# Patient Record
Sex: Female | Born: 1970 | Race: White | Hispanic: No | Marital: Single | Smoking: Former smoker
Health system: Southern US, Community
[De-identification: ages and names within clinical notes are randomized; demographics above are authoritative.]

## PROBLEM LIST (undated history)

## (undated) DIAGNOSIS — J45909 Unspecified asthma, uncomplicated: Secondary | ICD-10-CM

## (undated) DIAGNOSIS — I10 Essential (primary) hypertension: Secondary | ICD-10-CM

## (undated) DIAGNOSIS — D649 Anemia, unspecified: Secondary | ICD-10-CM

## (undated) DIAGNOSIS — R87810 Cervical high risk human papillomavirus (HPV) DNA test positive: Secondary | ICD-10-CM

## (undated) DIAGNOSIS — F1910 Other psychoactive substance abuse, uncomplicated: Secondary | ICD-10-CM

## (undated) DIAGNOSIS — R87629 Unspecified abnormal cytological findings in specimens from vagina: Secondary | ICD-10-CM

## (undated) HISTORY — DX: Unspecified abnormal cytological findings in specimens from vagina: R87.629

## (undated) HISTORY — DX: Unspecified asthma, uncomplicated: J45.909

## (undated) HISTORY — PX: CHOLECYSTECTOMY: SHX55

## (undated) HISTORY — DX: Other psychoactive substance abuse, uncomplicated: F19.10

## (undated) HISTORY — DX: Cervical high risk human papillomavirus (HPV) DNA test positive: R87.810

## (undated) HISTORY — DX: Essential (primary) hypertension: I10

---

## 1997-11-19 ENCOUNTER — Emergency Department (HOSPITAL_COMMUNITY): Admission: EM | Admit: 1997-11-19 | Discharge: 1997-11-19 | Payer: Self-pay | Admitting: Emergency Medicine

## 2001-07-13 ENCOUNTER — Other Ambulatory Visit: Admission: RE | Admit: 2001-07-13 | Discharge: 2001-07-13 | Payer: Self-pay | Admitting: Family Medicine

## 2004-04-30 ENCOUNTER — Emergency Department (HOSPITAL_COMMUNITY): Admission: EM | Admit: 2004-04-30 | Discharge: 2004-05-01 | Payer: Self-pay | Admitting: Emergency Medicine

## 2004-07-12 ENCOUNTER — Emergency Department (HOSPITAL_COMMUNITY): Admission: EM | Admit: 2004-07-12 | Discharge: 2004-07-12 | Payer: Self-pay | Admitting: Emergency Medicine

## 2004-07-19 ENCOUNTER — Ambulatory Visit (HOSPITAL_COMMUNITY): Admission: RE | Admit: 2004-07-19 | Discharge: 2004-07-19 | Payer: Self-pay | Admitting: Emergency Medicine

## 2012-05-14 ENCOUNTER — Encounter: Payer: Self-pay | Admitting: *Deleted

## 2012-05-14 DIAGNOSIS — I10 Essential (primary) hypertension: Secondary | ICD-10-CM | POA: Insufficient documentation

## 2012-05-14 DIAGNOSIS — J45909 Unspecified asthma, uncomplicated: Secondary | ICD-10-CM | POA: Insufficient documentation

## 2012-05-17 ENCOUNTER — Encounter: Payer: Self-pay | Admitting: Obstetrics & Gynecology

## 2012-05-17 ENCOUNTER — Ambulatory Visit (INDEPENDENT_AMBULATORY_CARE_PROVIDER_SITE_OTHER): Payer: Medicaid Other | Admitting: Obstetrics & Gynecology

## 2012-05-17 VITALS — BP 122/90 | Ht 64.0 in | Wt 149.5 lb

## 2012-05-17 DIAGNOSIS — Z3049 Encounter for surveillance of other contraceptives: Secondary | ICD-10-CM

## 2012-05-17 DIAGNOSIS — Z3202 Encounter for pregnancy test, result negative: Secondary | ICD-10-CM

## 2012-05-17 DIAGNOSIS — IMO0001 Reserved for inherently not codable concepts without codable children: Secondary | ICD-10-CM

## 2012-05-17 MED ORDER — MEDROXYPROGESTERONE ACETATE 150 MG/ML IM SUSP
150.0000 mg | Freq: Once | INTRAMUSCULAR | Status: AC
  Administered 2012-05-17: 150 mg via INTRAMUSCULAR

## 2012-08-09 ENCOUNTER — Encounter: Payer: Self-pay | Admitting: *Deleted

## 2012-08-09 ENCOUNTER — Ambulatory Visit: Payer: Medicaid Other

## 2012-08-09 ENCOUNTER — Other Ambulatory Visit: Payer: Medicaid Other | Admitting: Obstetrics & Gynecology

## 2012-08-12 ENCOUNTER — Other Ambulatory Visit: Payer: Medicaid Other | Admitting: Obstetrics & Gynecology

## 2012-08-16 ENCOUNTER — Encounter: Payer: Self-pay | Admitting: *Deleted

## 2012-08-16 ENCOUNTER — Other Ambulatory Visit: Payer: Medicaid Other | Admitting: Obstetrics & Gynecology

## 2012-08-17 ENCOUNTER — Encounter: Payer: Self-pay | Admitting: Adult Health

## 2012-08-17 ENCOUNTER — Ambulatory Visit (INDEPENDENT_AMBULATORY_CARE_PROVIDER_SITE_OTHER): Payer: Medicaid Other | Admitting: Adult Health

## 2012-08-17 VITALS — Ht 61.0 in | Wt 148.0 lb

## 2012-08-17 DIAGNOSIS — Z3202 Encounter for pregnancy test, result negative: Secondary | ICD-10-CM

## 2012-08-17 DIAGNOSIS — Z3049 Encounter for surveillance of other contraceptives: Secondary | ICD-10-CM

## 2012-08-17 MED ORDER — MEDROXYPROGESTERONE ACETATE 150 MG/ML IM SUSP
150.0000 mg | Freq: Once | INTRAMUSCULAR | Status: AC
  Administered 2012-08-17: 150 mg via INTRAMUSCULAR

## 2012-08-17 NOTE — Progress Notes (Signed)
Pt here for Depo shot. Not having any problems or concerns. Will come back in 12 weeks for next injection.

## 2012-09-01 ENCOUNTER — Other Ambulatory Visit: Payer: Medicaid Other | Admitting: Obstetrics & Gynecology

## 2012-09-21 ENCOUNTER — Other Ambulatory Visit: Payer: Medicaid Other | Admitting: Obstetrics & Gynecology

## 2012-11-08 ENCOUNTER — Other Ambulatory Visit: Payer: Self-pay | Admitting: Obstetrics and Gynecology

## 2012-11-09 ENCOUNTER — Encounter: Payer: Self-pay | Admitting: Adult Health

## 2012-11-09 ENCOUNTER — Ambulatory Visit (INDEPENDENT_AMBULATORY_CARE_PROVIDER_SITE_OTHER): Payer: Medicaid Other | Admitting: Adult Health

## 2012-11-09 VITALS — BP 144/100 | Ht 61.0 in | Wt 151.0 lb

## 2012-11-09 DIAGNOSIS — Z3049 Encounter for surveillance of other contraceptives: Secondary | ICD-10-CM

## 2012-11-09 DIAGNOSIS — Z3202 Encounter for pregnancy test, result negative: Secondary | ICD-10-CM

## 2012-11-09 DIAGNOSIS — Z309 Encounter for contraceptive management, unspecified: Secondary | ICD-10-CM

## 2012-11-09 MED ORDER — MEDROXYPROGESTERONE ACETATE 150 MG/ML IM SUSP
150.0000 mg | Freq: Once | INTRAMUSCULAR | Status: AC
  Administered 2012-11-09: 150 mg via INTRAMUSCULAR

## 2012-12-23 ENCOUNTER — Other Ambulatory Visit: Payer: Medicaid Other | Admitting: Obstetrics & Gynecology

## 2013-01-17 ENCOUNTER — Other Ambulatory Visit (HOSPITAL_COMMUNITY)
Admission: RE | Admit: 2013-01-17 | Discharge: 2013-01-17 | Disposition: A | Discharge status: Home / Self Care | Payer: Medicaid Other | Source: Ambulatory Visit | Attending: Obstetrics & Gynecology | Admitting: Obstetrics & Gynecology

## 2013-01-17 ENCOUNTER — Ambulatory Visit (INDEPENDENT_AMBULATORY_CARE_PROVIDER_SITE_OTHER): Payer: Medicaid Other | Admitting: Obstetrics & Gynecology

## 2013-01-17 ENCOUNTER — Encounter: Payer: Self-pay | Admitting: Obstetrics & Gynecology

## 2013-01-17 VITALS — BP 140/90 | Ht 59.0 in | Wt 151.0 lb

## 2013-01-17 DIAGNOSIS — Z01419 Encounter for gynecological examination (general) (routine) without abnormal findings: Secondary | ICD-10-CM | POA: Insufficient documentation

## 2013-01-17 DIAGNOSIS — Z0000 Encounter for general adult medical examination without abnormal findings: Secondary | ICD-10-CM

## 2013-01-17 MED ORDER — MEDROXYPROGESTERONE ACETATE 150 MG/ML IM SUSP: 150.0000 mg | INTRAMUSCULAR | Status: DC

## 2013-01-17 NOTE — Progress Notes (Signed)
Patient ID: [name], female   DOB: Oct 16, 1970, 42 y.o.   MRN: [id number] Subjective:     [name] is a 42 y.o. female here for a routine exam.  No LMP recorded. Patient has had an injection. G3P3 Birth Control Method:  depo Menstrual Calendar(currently): amenorrheic  Current complaints: none.   Current acute medical issues:  none   Recent Gynecologic History No LMP recorded. Patient has had an injection. Last Pap: 2013,  normal Last mammogram: na,  na  Past Medical History  Diagnosis Date  . Hypertension   . Asthma   . Drug abuse     Past Surgical History  Procedure Laterality Date  . Cholecystectomy      OB History   Grav Para Term Preterm Abortions TAB SAB Ect Mult Living   3 3        3       History   Social History  . Marital Status: Married    Spouse Name: N/A    Number of Children: N/A  . Years of Education: N/A   Social History Main Topics  . Smoking status: Current Every Day Smoker -- 1.00 packs/day for 17 years    Types: Cigarettes  . Smokeless tobacco: Never Used  . Alcohol Use: Yes     Comment: occ.  . Drug Use: No  . Sexual Activity: Yes    Birth Control/ Protection: Injection   Other Topics Concern  . None   Social History Narrative  . None    Family History  Problem Relation Age of Onset  . Hypertension Father      Review of Systems  Review of Systems  Constitutional: Negative for fever, chills, weight loss, malaise/fatigue and diaphoresis.  HENT: Negative for hearing loss, ear pain, nosebleeds, congestion, sore throat, neck pain, tinnitus and ear discharge.   Eyes: Negative for blurred vision, double vision, photophobia, pain, discharge and redness.  Respiratory: Negative for cough, hemoptysis, sputum production, shortness of breath, wheezing and stridor.   Cardiovascular: Negative for chest pain, palpitations, orthopnea, claudication, leg swelling and PND.  Gastrointestinal: negative for abdominal pain. Negative for  heartburn, nausea, vomiting, diarrhea, constipation, blood in stool and melena.  Genitourinary: Negative for dysuria, urgency, frequency, hematuria and flank pain.  Musculoskeletal: Negative for myalgias, back pain, joint pain and falls.  Skin: Negative for itching and rash.  Neurological: Negative for dizziness, tingling, tremors, sensory change, speech change, focal weakness, seizures, loss of consciousness, weakness and headaches.  Endo/Heme/Allergies: Negative for environmental allergies and polydipsia. Does not bruise/bleed easily.  Psychiatric/Behavioral: Negative for depression, suicidal ideas, hallucinations, memory loss and substance abuse. The patient is not nervous/anxious and does not have insomnia.        Objective:    Physical Exam  Vitals reviewed. Constitutional: She is oriented to person, place, and time. She appears well-developed and well-nourished.  HENT:  Head: Normocephalic and atraumatic.        Right Ear: External ear normal.  Left Ear: External ear normal.  Nose: Nose normal.  Mouth/Throat: Oropharynx is clear and moist.  Eyes: Conjunctivae and EOM are normal. Pupils are equal, round, and reactive to light. Right eye exhibits no discharge. Left eye exhibits no discharge. No scleral icterus.  Neck: Normal range of motion. Neck supple. No tracheal deviation present. No thyromegaly present.  Cardiovascular: Normal rate, regular rhythm, normal heart sounds and intact distal pulses.  Exam reveals no gallop and no friction rub.   No murmur heard. Respiratory: Effort normal  and breath sounds normal. No respiratory distress. She has no wheezes. She has no rales. She exhibits no tenderness.  GI: Soft. Bowel sounds are normal. She exhibits no distension and no mass. There is no tenderness. There is no rebound and no guarding.  Genitourinary:  Breasts no masses skin changes or nipple changes bilaterally      Vulva is normal without lesions Vagina is pink moist without  discharge Cervix normal in appearance and pap is done Uterus is normal size shape and contour Adnexa is negative with normal sized ovaries   Musculoskeletal: Normal range of motion. She exhibits no edema and no tenderness.  Neurological: She is alert and oriented to person, place, and time. She has normal reflexes. She displays normal reflexes. No cranial nerve deficit. She exhibits normal muscle tone. Coordination normal.  Skin: Skin is warm and dry. No rash noted. No erythema. No pallor.  Psychiatric: She has a normal mood and affect. Her behavior is normal. Judgment and thought content normal.       Assessment:    Healthy female exam.    Plan:    Contraception: Depo-Provera injections. Mammogram ordered. Follow up in: 1 year.

## 2013-01-17 NOTE — Addendum Note (Signed)
Addended by: Richardson Chiquito on: 01/17/2013 02:56 PM   Modules accepted: Orders

## 2013-02-01 ENCOUNTER — Ambulatory Visit: Payer: Medicaid Other

## 2013-02-08 ENCOUNTER — Ambulatory Visit (INDEPENDENT_AMBULATORY_CARE_PROVIDER_SITE_OTHER): Payer: Medicaid Other | Admitting: Adult Health

## 2013-02-08 ENCOUNTER — Encounter: Payer: Self-pay | Admitting: Adult Health

## 2013-02-08 VITALS — BP 110/72 | Ht 61.0 in | Wt 152.0 lb

## 2013-02-08 DIAGNOSIS — Z3200 Encounter for pregnancy test, result unknown: Secondary | ICD-10-CM

## 2013-02-08 DIAGNOSIS — Z3202 Encounter for pregnancy test, result negative: Secondary | ICD-10-CM

## 2013-02-08 DIAGNOSIS — Z309 Encounter for contraceptive management, unspecified: Secondary | ICD-10-CM

## 2013-02-08 DIAGNOSIS — Z3049 Encounter for surveillance of other contraceptives: Secondary | ICD-10-CM

## 2013-02-08 LAB — POCT URINE PREGNANCY: Preg Test, Ur: NEGATIVE

## 2013-02-08 MED ORDER — MEDROXYPROGESTERONE ACETATE 150 MG/ML IM SUSP
150.0000 mg | Freq: Once | INTRAMUSCULAR | Status: AC
  Administered 2013-02-08: 150 mg via INTRAMUSCULAR

## 2013-05-03 ENCOUNTER — Ambulatory Visit: Payer: Medicaid Other

## 2013-05-06 ENCOUNTER — Encounter: Payer: Self-pay | Admitting: Obstetrics & Gynecology

## 2013-05-06 ENCOUNTER — Ambulatory Visit (INDEPENDENT_AMBULATORY_CARE_PROVIDER_SITE_OTHER): Payer: Medicaid Other | Admitting: Obstetrics & Gynecology

## 2013-05-06 ENCOUNTER — Encounter (INDEPENDENT_AMBULATORY_CARE_PROVIDER_SITE_OTHER): Payer: Self-pay

## 2013-05-06 VITALS — BP 144/90 | Ht 61.0 in | Wt 153.5 lb

## 2013-05-06 DIAGNOSIS — Z3049 Encounter for surveillance of other contraceptives: Secondary | ICD-10-CM

## 2013-05-06 DIAGNOSIS — Z3202 Encounter for pregnancy test, result negative: Secondary | ICD-10-CM

## 2013-05-06 DIAGNOSIS — Z309 Encounter for contraceptive management, unspecified: Secondary | ICD-10-CM

## 2013-05-06 LAB — POCT URINE PREGNANCY: Preg Test, Ur: NEGATIVE

## 2013-05-06 MED ORDER — MEDROXYPROGESTERONE ACETATE 150 MG/ML IM SUSP
150.0000 mg | Freq: Once | INTRAMUSCULAR | Status: AC
  Administered 2013-05-06: 150 mg via INTRAMUSCULAR

## 2013-05-06 NOTE — Progress Notes (Signed)
Pt here for Depo. No complaints at this time. To return in 12 weeks for next shot. JSY 

## 2013-07-29 ENCOUNTER — Ambulatory Visit (INDEPENDENT_AMBULATORY_CARE_PROVIDER_SITE_OTHER): Payer: Medicaid Other | Admitting: Adult Health

## 2013-07-29 DIAGNOSIS — Z3202 Encounter for pregnancy test, result negative: Secondary | ICD-10-CM

## 2013-07-29 DIAGNOSIS — Z308 Encounter for other contraceptive management: Secondary | ICD-10-CM

## 2013-07-29 DIAGNOSIS — Z3049 Encounter for surveillance of other contraceptives: Secondary | ICD-10-CM

## 2013-07-29 LAB — POCT URINE PREGNANCY: PREG TEST UR: NEGATIVE

## 2013-07-29 MED ORDER — MEDROXYPROGESTERONE ACETATE 150 MG/ML IM SUSP
150.0000 mg | Freq: Once | INTRAMUSCULAR | Status: AC
  Administered 2013-07-29: 150 mg via INTRAMUSCULAR

## 2013-09-18 ENCOUNTER — Observation Stay (HOSPITAL_COMMUNITY)
Admission: EM | Admit: 2013-09-18 | Discharge: 2013-09-20 | Disposition: A | Discharge status: Home / Self Care | Payer: Medicaid Other | Attending: Orthopedic Surgery | Admitting: Orthopedic Surgery

## 2013-09-18 ENCOUNTER — Emergency Department (HOSPITAL_COMMUNITY): Payer: Medicaid Other

## 2013-09-18 ENCOUNTER — Encounter (HOSPITAL_COMMUNITY): Payer: Self-pay | Admitting: Emergency Medicine

## 2013-09-18 DIAGNOSIS — S99919A Unspecified injury of unspecified ankle, initial encounter: Secondary | ICD-10-CM

## 2013-09-18 DIAGNOSIS — F17200 Nicotine dependence, unspecified, uncomplicated: Secondary | ICD-10-CM | POA: Insufficient documentation

## 2013-09-18 DIAGNOSIS — S8990XA Unspecified injury of unspecified lower leg, initial encounter: Secondary | ICD-10-CM | POA: Insufficient documentation

## 2013-09-18 DIAGNOSIS — J45909 Unspecified asthma, uncomplicated: Secondary | ICD-10-CM | POA: Insufficient documentation

## 2013-09-18 DIAGNOSIS — Z79899 Other long term (current) drug therapy: Secondary | ICD-10-CM | POA: Diagnosis not present

## 2013-09-18 DIAGNOSIS — S3210XA Unspecified fracture of sacrum, initial encounter for closed fracture: Secondary | ICD-10-CM

## 2013-09-18 DIAGNOSIS — R109 Unspecified abdominal pain: Secondary | ICD-10-CM | POA: Insufficient documentation

## 2013-09-18 DIAGNOSIS — Z3202 Encounter for pregnancy test, result negative: Secondary | ICD-10-CM | POA: Diagnosis not present

## 2013-09-18 DIAGNOSIS — S99929A Unspecified injury of unspecified foot, initial encounter: Secondary | ICD-10-CM

## 2013-09-18 DIAGNOSIS — S82853A Displaced trimalleolar fracture of unspecified lower leg, initial encounter for closed fracture: Principal | ICD-10-CM | POA: Insufficient documentation

## 2013-09-18 DIAGNOSIS — S1093XA Contusion of unspecified part of neck, initial encounter: Secondary | ICD-10-CM | POA: Diagnosis not present

## 2013-09-18 DIAGNOSIS — Y92410 Unspecified street and highway as the place of occurrence of the external cause: Secondary | ICD-10-CM | POA: Diagnosis not present

## 2013-09-18 DIAGNOSIS — S82852A Displaced trimalleolar fracture of left lower leg, initial encounter for closed fracture: Secondary | ICD-10-CM

## 2013-09-18 DIAGNOSIS — S322XXA Fracture of coccyx, initial encounter for closed fracture: Secondary | ICD-10-CM

## 2013-09-18 DIAGNOSIS — S0083XA Contusion of other part of head, initial encounter: Secondary | ICD-10-CM | POA: Insufficient documentation

## 2013-09-18 DIAGNOSIS — S0003XA Contusion of scalp, initial encounter: Secondary | ICD-10-CM | POA: Insufficient documentation

## 2013-09-18 DIAGNOSIS — Y9389 Activity, other specified: Secondary | ICD-10-CM | POA: Insufficient documentation

## 2013-09-18 DIAGNOSIS — I10 Essential (primary) hypertension: Secondary | ICD-10-CM | POA: Insufficient documentation

## 2013-09-18 LAB — COMPREHENSIVE METABOLIC PANEL
ALK PHOS: 103 U/L (ref 39–117)
ALT: 14 U/L (ref 0–35)
ANION GAP: 16 — AB (ref 5–15)
AST: 22 U/L (ref 0–37)
Albumin: 4.1 g/dL (ref 3.5–5.2)
BUN: 10 mg/dL (ref 6–23)
CO2: 20 mEq/L (ref 19–32)
Calcium: 8.4 mg/dL (ref 8.4–10.5)
Chloride: 109 mEq/L (ref 96–112)
Creatinine, Ser: 0.69 mg/dL (ref 0.50–1.10)
GFR calc Af Amer: >90 mL/min (ref >90)
GFR calc non Af Amer: >90 mL/min (ref >90)
Glucose, Bld: 98 mg/dL (ref 70–99)
Potassium: 3.7 mEq/L (ref 3.7–5.3)
Sodium: 145 mEq/L (ref 137–147)
TOTAL PROTEIN: 6.9 g/dL (ref 6.0–8.3)
Total Bilirubin: 0.8 mg/dL (ref 0.3–1.2)

## 2013-09-18 LAB — URINALYSIS, ROUTINE W REFLEX MICROSCOPIC
Bilirubin Urine: NEGATIVE
GLUCOSE, UA: NEGATIVE mg/dL
Leukocytes, UA: NEGATIVE
Nitrite: NEGATIVE
PH: 5.5 (ref 5.0–8.0)
Protein, ur: 30 mg/dL — AB
SPECIFIC GRAVITY, URINE: 1.025 (ref 1.005–1.030)
UROBILINOGEN UA: 0.2 mg/dL (ref 0.0–1.0)

## 2013-09-18 LAB — URINE MICROSCOPIC-ADD ON

## 2013-09-18 LAB — CBC WITH DIFFERENTIAL/PLATELET
BASOS ABS: 0 10*3/uL (ref 0.0–0.1)
Basophils Relative: 0 % (ref 0–1)
EOS PCT: 0 % (ref 0–5)
Eosinophils Absolute: 0 10*3/uL (ref 0.0–0.7)
HCT: 42.6 % (ref 36.0–46.0)
HEMOGLOBIN: 14.7 g/dL (ref 12.0–15.0)
LYMPHS ABS: 1.9 10*3/uL (ref 0.7–4.0)
Lymphocytes Relative: 9 % — ABNORMAL LOW (ref 12–46)
MCH: 31.8 pg (ref 26.0–34.0)
MCHC: 34.5 g/dL (ref 30.0–36.0)
MCV: 92.2 fL (ref 78.0–100.0)
MONO ABS: 2 10*3/uL — AB (ref 0.1–1.0)
MONOS PCT: 9 % (ref 3–12)
NEUTROS ABS: 17.2 10*3/uL — AB (ref 1.7–7.7)
NEUTROS PCT: 82 % — AB (ref 43–77)
PLATELETS: 383 10*3/uL (ref 150–400)
RBC: 4.62 MIL/uL (ref 3.87–5.11)
RDW: 13 % (ref 11.5–15.5)
WBC: 21.2 10*3/uL — AB (ref 4.0–10.5)

## 2013-09-18 LAB — POC URINE PREG, ED: PREG TEST UR: NEGATIVE

## 2013-09-18 MED ORDER — SODIUM CHLORIDE 0.9 % IV SOLN: INTRAVENOUS | Status: DC

## 2013-09-18 MED ORDER — MORPHINE SULFATE 4 MG/ML IJ SOLN
INTRAMUSCULAR | Status: AC
  Administered 2013-09-18: 4 mg via INTRAVENOUS
  Filled 2013-09-18: qty 1

## 2013-09-18 MED ORDER — MORPHINE SULFATE 4 MG/ML IJ SOLN
4.0000 mg | Freq: Once | INTRAMUSCULAR | Status: AC
  Administered 2013-09-18: 4 mg via INTRAVENOUS

## 2013-09-18 MED ORDER — HYDROMORPHONE HCL PF 1 MG/ML IJ SOLN
1.0000 mg | INTRAMUSCULAR | Status: AC | PRN
  Administered 2013-09-18: 1 mg via INTRAVENOUS
  Filled 2013-09-18 (×4): qty 1

## 2013-09-18 MED ORDER — HYDROCODONE-ACETAMINOPHEN 5-325 MG PO TABS
1.0000 | ORAL_TABLET | ORAL | Status: DC | PRN
  Administered 2013-09-18 – 2013-09-19 (×2): 1 via ORAL
  Filled 2013-09-18 (×2): qty 1

## 2013-09-18 MED ORDER — IOHEXOL 300 MG/ML  SOLN
100.0000 mL | Freq: Once | INTRAMUSCULAR | Status: AC | PRN
  Administered 2013-09-18: 100 mL via INTRAVENOUS

## 2013-09-18 MED ORDER — MORPHINE SULFATE 4 MG/ML IJ SOLN
4.0000 mg | Freq: Once | INTRAMUSCULAR | Status: AC
  Administered 2013-09-18: 4 mg via INTRAVENOUS
  Filled 2013-09-18: qty 1

## 2013-09-18 MED ORDER — MIDAZOLAM HCL 2 MG/2ML IJ SOLN
6.0000 mg | Freq: Once | INTRAMUSCULAR | Status: DC
  Filled 2013-09-18: qty 6

## 2013-09-18 MED ORDER — MIDAZOLAM HCL 2 MG/2ML IJ SOLN
INTRAMUSCULAR | Status: AC | PRN
  Administered 2013-09-18: 3 mg via INTRAVENOUS

## 2013-09-18 MED ORDER — ONDANSETRON HCL 4 MG/2ML IJ SOLN: 4.0000 mg | Freq: Three times a day (TID) | INTRAMUSCULAR | Status: DC | PRN

## 2013-09-18 MED ORDER — HYDROMORPHONE HCL PF 1 MG/ML IJ SOLN
0.5000 mg | INTRAMUSCULAR | Status: DC | PRN
  Administered 2013-09-18 – 2013-09-19 (×3): 1 mg via INTRAVENOUS
  Filled 2013-09-18: qty 1

## 2013-09-18 MED ORDER — ONDANSETRON HCL 4 MG/2ML IJ SOLN
4.0000 mg | Freq: Four times a day (QID) | INTRAMUSCULAR | Status: DC | PRN
Start: 2013-09-18 — End: 2013-09-20

## 2013-09-18 MED ORDER — MEDROXYPROGESTERONE ACETATE 150 MG/ML IM SUSP
150.0000 mg | INTRAMUSCULAR | Status: DC
Start: 2013-09-18 — End: 2013-09-18

## 2013-09-18 MED ORDER — HYDROMORPHONE HCL PF 1 MG/ML IJ SOLN
INTRAMUSCULAR | Status: AC
  Filled 2013-09-18: qty 1

## 2013-09-18 MED ORDER — SODIUM CHLORIDE 0.9 % IV SOLN
INTRAVENOUS | Status: AC
  Administered 2013-09-18: 21:00:00 via INTRAVENOUS
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
## 2013-09-19 MED ORDER — KETOROLAC TROMETHAMINE 30 MG/ML IJ SOLN
30.0000 mg | Freq: Four times a day (QID) | INTRAMUSCULAR | Status: DC
  Administered 2013-09-19: 30 mg via INTRAVENOUS
  Filled 2013-09-19: qty 1

## 2013-09-19 MED ORDER — OXYCODONE-ACETAMINOPHEN 5-325 MG PO TABS: 1.0000 | ORAL_TABLET | ORAL | Status: DC | PRN

## 2013-09-19 MED ORDER — OXYCODONE-ACETAMINOPHEN 5-325 MG PO TABS
1.0000 | ORAL_TABLET | ORAL | Status: DC | PRN
  Administered 2013-09-19 – 2013-09-20 (×6): 1 via ORAL
  Filled 2013-09-19 (×7): qty 1

## 2013-09-19 MED ORDER — HYDROMORPHONE HCL PF 1 MG/ML IJ SOLN: 1.0000 mg | INTRAMUSCULAR | Status: DC | PRN

## 2013-09-19 MED ORDER — HYDROMORPHONE HCL PF 1 MG/ML IJ SOLN
2.0000 mg | INTRAMUSCULAR | Status: DC | PRN
  Administered 2013-09-19 (×3): 2 mg via INTRAVENOUS
  Filled 2013-09-19 (×2): qty 2
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
## 2013-09-19 NOTE — Progress Notes (Signed)
UR Completed.  [name] [phone] 09/19/2013  

## 2013-09-19 NOTE — Care Management Note (Signed)
    Page 1 of 1   09/20/2013     11:41:39 AM CARE MANAGEMENT NOTE 09/20/2013  Patient:  [name],[name]   Account Number:  [id number]  Date Initiated:  09/19/2013  Documentation initiated by:  Anibal HendersonBOLDEN,Tejuan Gholson  Subjective/Objective Assessment:   Admitted with Fx ankle. had closed reduction, and will return for surgery when swelling goes down. Pt was to be D/C home today but chg to po meds and pt in severe pain, so is to spend another night here for IV pain control     Action/Plan:   Will need rolling walker for home use. PT recommended HH PT, but pt will be on bed rest with BRP with leg elevated. lives with spouse and 42 yr old son.   Anticipated DC Date:  09/20/2013   Anticipated DC Plan:  HOME/SELF CARE      DC Planning Services  CM consult      PAC Choice  DURABLE MEDICAL EQUIPMENT   Choice offered to / List presented to:     DME arranged  [facility]      DME agency  [facility]        Status of service:  Completed, signed off Medicare Important Message given?   (If response is "NO", the following Medicare IM given date fields will be blank) Date Medicare IM given:   Medicare IM given by:   Date Additional Medicare IM given:   Additional Medicare IM given by:    Discharge Disposition:  HOME/SELF CARE  Per UR Regulation:  Reviewed for med. necessity/level of care/duration of stay  If discussed at Long Length of Stay Meetings, dates discussed:    Comments:  09/20/13 1130 Anibal HendersonGeneva Krystn Dermody RN/CM Pt being D/C home today 09/19/13 1600 Anibal HendersonGeneva Eward Rutigliano RN/CM

## 2013-09-20 MED ORDER — KETOROLAC TROMETHAMINE 10 MG PO TABS
10.0000 mg | ORAL_TABLET | Freq: Four times a day (QID) | ORAL | Status: DC
  Administered 2013-09-20 (×4): 10 mg via ORAL
  Filled 2013-09-20 (×4): qty 1

## 2013-09-20 MED ORDER — IBUPROFEN 800 MG PO TABS: 800.0000 mg | ORAL_TABLET | Freq: Three times a day (TID) | ORAL | Status: DC | PRN

## 2013-09-20 MED ORDER — OXYCODONE-ACETAMINOPHEN 5-325 MG PO TABS: 1.0000 | ORAL_TABLET | ORAL | Status: DC | PRN
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
## 2013-09-20 NOTE — Progress Notes (Signed)
3:05pm D/C instructions & paperwork/hard Rxs given to pt. Pt confirmed that she understands d/c instructions to not bear weight on LEFT ankle & elevate when sitting. Pt aware to be at pre-op apt with Dr.Harrison on 09/27/13 @ 10:15AM. Hard Rxs given to pt to be filled. Pt currently waiting on a ride from boyfriend when he gets off work, expected time between 8pm-9pm. IV removed last night on night shift, shift covered, clean, dry with no bleeding noted at this time. Pt reminded to follow d/c instructions to optimize best outcome including no driving at this time, she confirmed that she understands. Will assist pt with change of clothes this shift after dinner as requested by pt. Walker noted in pt's room brought by [facility], pt aware to take home with her. Pt stated she has all of her belongings with her gathered together to take home that she had brought here with her.

## 2013-09-20 NOTE — Progress Notes (Signed)
Assisted pt with making long distance phone call to her boyfriend regarding pt [facility]. Pt stated that her boyfriend will be picking her up today around 8pm-9pm after he gets off work.

## 2013-09-27 ENCOUNTER — Encounter (HOSPITAL_COMMUNITY)
Admission: RE | Admit: 2013-09-27 | Discharge: 2013-09-27 | Disposition: A | Discharge status: Home / Self Care | Payer: Medicaid Other | Source: Ambulatory Visit | Attending: Orthopedic Surgery | Admitting: Orthopedic Surgery

## 2013-09-27 ENCOUNTER — Encounter (HOSPITAL_COMMUNITY): Payer: Self-pay

## 2013-09-27 ENCOUNTER — Ambulatory Visit (INDEPENDENT_AMBULATORY_CARE_PROVIDER_SITE_OTHER): Payer: Self-pay | Admitting: Orthopedic Surgery

## 2013-09-27 ENCOUNTER — Telehealth: Payer: Self-pay | Admitting: Orthopedic Surgery

## 2013-09-27 VITALS — BP 144/104 | Ht 61.0 in | Wt 140.0 lb

## 2013-09-27 DIAGNOSIS — S82853A Displaced trimalleolar fracture of unspecified lower leg, initial encounter for closed fracture: Secondary | ICD-10-CM | POA: Insufficient documentation

## 2013-09-27 DIAGNOSIS — S82852A Displaced trimalleolar fracture of left lower leg, initial encounter for closed fracture: Secondary | ICD-10-CM | POA: Insufficient documentation

## 2013-09-27 DIAGNOSIS — I10 Essential (primary) hypertension: Secondary | ICD-10-CM | POA: Insufficient documentation

## 2013-09-27 DIAGNOSIS — Z01810 Encounter for preprocedural cardiovascular examination: Secondary | ICD-10-CM | POA: Insufficient documentation

## 2013-09-27 DIAGNOSIS — S82851D Displaced trimalleolar fracture of right lower leg, subsequent encounter for closed fracture with routine healing: Secondary | ICD-10-CM

## 2013-09-27 DIAGNOSIS — Z01818 Encounter for other preprocedural examination: Secondary | ICD-10-CM | POA: Diagnosis not present

## 2013-09-27 DIAGNOSIS — IMO0001 Reserved for inherently not codable concepts without codable children: Secondary | ICD-10-CM

## 2013-09-27 HISTORY — DX: Anemia, unspecified: D64.9

## 2013-09-27 LAB — PREGNANCY, URINE: Preg Test, Ur: NEGATIVE

## 2013-09-27 NOTE — Pre-Procedure Instructions (Signed)
Patient in for PAT. Bp 145/95. She states she has been on BP meds in the past but has been out of them for about "two months because I cant pay for them." she said that she tried to call Dr Olena LeatherwoodHasanaj to get meds refilled and they wanted her to come in but she told them she could not due to her condition and "they still wouldn't call my medicine in". Called Dr Bartholomew CrewsHasanaj"s office and Amy at his office states that the reason they wouldn't call meds in was because it had been about 1 year since they had seen patient and the last two times they had her scheduled she did not show up. Patient was instructed on importance of taking blood pressure meds (lorastan and HCTZ) as scheduled and importance of showing up for office visit today prior to surgery or surgery could be cancelled if blood pressure were too high. She has appointment to see Dr Olena LeatherwoodHasanaj at 1630 today. Dr Jayme CloudGonzalez aware of above. Called Dr Harrison's office and spoke with Asher MuirJamie, LPN. She is to give this information to Dr Romeo AppleHarrison when he returns to office.

## 2013-09-27 NOTE — Telephone Encounter (Addendum)
Regarding surgery planned for 09/28/13 at [facility] (date subject to change pending clearance), CPT 29822 - per patient's insurer, Medicaid's online system [employer], no pre-authorization required.

## 2013-09-27 NOTE — Progress Notes (Signed)
Emergency room followup status post fracture dislocation of the left ankle   Chief Complaint  Patient presents with  . Follow-up    Follow up on left ankle fracture, DOI 09-20-13.    I did close reduction in the emergency room I kept the patient overnight for pain control and she is here for her followup visit she will need open treatment internal fixation of the left ankle. The preop consultation with the patient has been completed risks and benefits explained surgery explained surgery will be open to an internal fixation left ankle.

## 2013-09-27 NOTE — Patient Instructions (Signed)
ASPIN PALOMAREZ  09/27/2013   Your procedure is scheduled on:  09/28/2013  Report to [facility] at  845  AM.  Call this number if you have problems the morning of surgery: [phone]   Remember:   Do not eat food or drink liquids after midnight.   Take these medicines the morning of surgery with A SIP OF WATER: oxycodone   Do not wear jewelry, make-up or nail polish.  Do not wear lotions, powders, or perfumes.   Do not shave 48 hours prior to surgery. Men may shave face and neck.  Do not bring valuables to the hospital.  [facility] is not responsible  for any belongings or valuables.               Contacts, dentures or bridgework may not be worn into surgery.  Leave suitcase in the car. After surgery it may be brought to your room.  For patients admitted to the hospital, discharge time is determined by your treatment team.               Patients discharged the day of surgery will not be allowed to drive home.  Name and phone number of your driver: family  Special Instructions: Shower using CHG 2 nights before surgery and the night before surgery.  If you shower the day of surgery use CHG.  Use special wash - you have one bottle of CHG for all showers.  You should use approximately 1/3 of the bottle for each shower.   Please read over the following fact sheets that you were given: Pain Booklet, Coughing and Deep Breathing, Surgical Site Infection Prevention, Anesthesia Post-op Instructions and Care and Recovery After Surgery Ankle Fracture A fracture is a break in a bone. The ankle joint is made up of three bones. These include the lower (distal)sections of your lower leg bones, called the tibia and fibula, along with a bone in your foot, called the talus. Depending on how bad the break is and if more than one ankle joint bone is broken, a cast or splint is used to protect and keep your injured bone from moving while it heals. Sometimes, surgery is required to help the fracture  heal properly.  There are two general types of fractures:  Stable fracture. This includes a single fracture line through one bone, with no injury to ankle ligaments. A fracture of the talus that does not have any displacement (movement of the bone on either side of the fracture line) is also stable.  Unstable fracture. This includes more than one fracture line through one or more bones in the ankle joint. It also includes fractures that have displacement of the bone on either side of the fracture line. CAUSES  A direct blow to the ankle.   Quickly and severely twisting your ankle.  Trauma, such as a car accident or falling from a significant height. RISK FACTORS You may be at a higher risk of ankle fracture if:  You have certain medical conditions.  You are involved in high-impact sports.  You are involved in a high-impact car accident. SIGNS AND SYMPTOMS   Tender and swollen ankle.  Bruising around the injured ankle.  Pain on movement of the ankle.  Difficulty walking or putting weight on the ankle.  A cold foot below the site of the ankle injury. This can occur if the blood vessels passing through your injured ankle were also damaged.  Numbness in the foot  below the site of the ankle injury. DIAGNOSIS  An ankle fracture is usually diagnosed with a physical exam and X-rays. A CT scan may also be required for complex fractures. TREATMENT  Stable fractures are treated with a cast or splint and using crutches to avoid putting weight on your injured ankle. This is followed by an ankle strengthening program. Some patients require a special type of cast, depending on other medical problems they may have. Unstable fractures require surgery to ensure the bones heal properly. Your health care provider will tell you what type of fracture you have and the best treatment for your condition. HOME CARE INSTRUCTIONS   Review correct crutch use with your health care provider and use your  crutches as directed. Safe use of crutches is extremely important. Misuse of crutches can cause you to fall or cause injury to nerves in your hands or armpits.  Do not put weight or pressure on the injured ankle until directed by your health care provider.  To lessen the swelling, keep the injured leg elevated while sitting or lying down.  Apply ice to the injured area:  Put ice in a plastic bag.  Place a towel between your cast and the bag.  Leave the ice on for 20 minutes, 2-3 times a day.  If you have a plaster or fiberglass cast:  Do not try to scratch the skin under the cast with any objects. This can increase your risk of skin infection.  Check the skin around the cast every day. You may put lotion on any red or sore areas.  Keep your cast dry and clean.  If you have a plaster splint:  Wear the splint as directed.  You may loosen the elastic around the splint if your toes become numb, tingle, or turn cold or blue.  Do not put pressure on any part of your cast or splint; it may break. Rest your cast only on a pillow the first 24 hours until it is fully hardened.  Your cast or splint can be protected during bathing with a plastic bag sealed to your skin with medical tape. Do not lower the cast or splint into water.  Take medicines as directed by your health care provider. Only take over-the-counter or prescription medicines for pain, discomfort, or fever as directed by your health care provider.  Do not drive a vehicle until your health care provider specifically tells you it is safe to do so.  If your health care provider has given you a follow-up appointment, it is very important to keep that appointment. Not keeping the appointment could result in a chronic or permanent injury, pain, and disability. If you have any problem keeping the appointment, call the facility for assistance. SEEK MEDICAL CARE IF: You develop increased swelling or discomfort. SEEK IMMEDIATE MEDICAL  CARE IF:   Your cast gets damaged or breaks.  You have continued severe pain.  You develop new pain or swelling after the cast was put on.  Your skin or toenails below the injury turn blue or gray.  Your skin or toenails below the injury feel cold, numb, or have loss of sensitivity to touch.  There is a bad smell or pus draining from under the cast. MAKE SURE YOU:   Understand these instructions.  Will watch your condition.  Will get help right away if you are not doing well or get worse. Document Released: 01/25/2000 Document Revised: 02/01/2013 Document Reviewed: 08/26/2012 [facility] Patient Information 2015 [place], [place]. This  information is not intended to replace advice given to you by your health care provider. Make sure you discuss any questions you have with your health care provider. PATIENT INSTRUCTIONS POST-ANESTHESIA  IMMEDIATELY FOLLOWING SURGERY:  Do not drive or operate machinery for the first twenty four hours after surgery.  Do not make any important decisions for twenty four hours after surgery or while taking narcotic pain medications or sedatives.  If you develop intractable nausea and vomiting or a severe headache please notify your doctor immediately.  FOLLOW-UP:  Please make an appointment with your surgeon as instructed. You do not need to follow up with anesthesia unless specifically instructed to do so.  WOUND CARE INSTRUCTIONS (if applicable):  Keep a dry clean dressing on the anesthesia/puncture wound site if there is drainage.  Once the wound has quit draining you may leave it open to air.  Generally you should leave the bandage intact for twenty four hours unless there is drainage.  If the epidural site drains for more than 36-48 hours please call the anesthesia department.  QUESTIONS?:  Please feel free to call your physician or the hospital operator if you have any questions, and they will be happy to assist you.
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
## 2013-09-28 ENCOUNTER — Ambulatory Visit (HOSPITAL_COMMUNITY)
Admission: RE | Admit: 2013-09-28 | Discharge: 2013-09-28 | Disposition: A | Discharge status: Home / Self Care | Payer: Medicaid Other | Source: Ambulatory Visit | Attending: Orthopedic Surgery | Admitting: Orthopedic Surgery

## 2013-09-28 ENCOUNTER — Ambulatory Visit (HOSPITAL_COMMUNITY): Payer: Medicaid Other | Admitting: Anesthesiology

## 2013-09-28 ENCOUNTER — Encounter (HOSPITAL_COMMUNITY)
Admission: RE | Disposition: A | Discharge status: Home / Self Care | Payer: Self-pay | Source: Ambulatory Visit | Attending: Orthopedic Surgery

## 2013-09-28 ENCOUNTER — Ambulatory Visit (HOSPITAL_COMMUNITY): Payer: Medicaid Other

## 2013-09-28 ENCOUNTER — Encounter (HOSPITAL_COMMUNITY): Payer: Medicaid Other | Admitting: Anesthesiology

## 2013-09-28 ENCOUNTER — Encounter (HOSPITAL_COMMUNITY): Payer: Self-pay | Admitting: *Deleted

## 2013-09-28 DIAGNOSIS — J45909 Unspecified asthma, uncomplicated: Secondary | ICD-10-CM | POA: Insufficient documentation

## 2013-09-28 DIAGNOSIS — I10 Essential (primary) hypertension: Secondary | ICD-10-CM | POA: Insufficient documentation

## 2013-09-28 DIAGNOSIS — S82852D Displaced trimalleolar fracture of left lower leg, subsequent encounter for closed fracture with routine healing: Secondary | ICD-10-CM

## 2013-09-28 DIAGNOSIS — S93439A Sprain of tibiofibular ligament of unspecified ankle, initial encounter: Secondary | ICD-10-CM | POA: Insufficient documentation

## 2013-09-28 DIAGNOSIS — S82853A Displaced trimalleolar fracture of unspecified lower leg, initial encounter for closed fracture: Secondary | ICD-10-CM | POA: Insufficient documentation

## 2013-09-28 DIAGNOSIS — Y998 Other external cause status: Secondary | ICD-10-CM | POA: Diagnosis not present

## 2013-09-28 DIAGNOSIS — Y92410 Unspecified street and highway as the place of occurrence of the external cause: Secondary | ICD-10-CM | POA: Diagnosis not present

## 2013-09-28 DIAGNOSIS — S8290XD Unspecified fracture of unspecified lower leg, subsequent encounter for closed fracture with routine healing: Secondary | ICD-10-CM

## 2013-09-28 DIAGNOSIS — Y9389 Activity, other specified: Secondary | ICD-10-CM | POA: Insufficient documentation

## 2013-09-28 HISTORY — PX: ORIF ANKLE FRACTURE: SHX5408

## 2013-09-28 SURGERY — OPEN REDUCTION INTERNAL FIXATION (ORIF) ANKLE FRACTURE
Anesthesia: General | Laterality: Left

## 2013-09-28 MED ORDER — BUPIVACAINE-EPINEPHRINE (PF) 0.5% -1:200000 IJ SOLN
INTRAMUSCULAR | Status: DC | PRN
  Administered 2013-09-28: 60 mL

## 2013-09-28 MED ORDER — LIDOCAINE HCL (PF) 1 % IJ SOLN
INTRAMUSCULAR | Status: AC
  Filled 2013-09-28: qty 5

## 2013-09-28 MED ORDER — ROCURONIUM BROMIDE 50 MG/5ML IV SOLN
INTRAVENOUS | Status: AC
  Filled 2013-09-28: qty 1

## 2013-09-28 MED ORDER — MIDAZOLAM HCL 2 MG/2ML IJ SOLN
INTRAMUSCULAR | Status: AC
  Filled 2013-09-28: qty 2

## 2013-09-28 MED ORDER — MIDAZOLAM HCL 2 MG/2ML IJ SOLN
1.0000 mg | INTRAMUSCULAR | Status: DC | PRN
  Administered 2013-09-28 (×2): 2 mg via INTRAVENOUS

## 2013-09-28 MED ORDER — OXYCODONE-ACETAMINOPHEN 5-325 MG PO TABS: 1.0000 | ORAL_TABLET | ORAL | Status: DC | PRN

## 2013-09-28 MED ORDER — FENTANYL CITRATE 0.05 MG/ML IJ SOLN
INTRAMUSCULAR | Status: AC
  Filled 2013-09-28: qty 2

## 2013-09-28 MED ORDER — LABETALOL HCL 5 MG/ML IV SOLN
INTRAVENOUS | Status: AC
  Filled 2013-09-28: qty 4

## 2013-09-28 MED ORDER — ONDANSETRON HCL 4 MG/2ML IJ SOLN: 4.0000 mg | Freq: Once | INTRAMUSCULAR | Status: DC | PRN

## 2013-09-28 MED ORDER — NEOSTIGMINE METHYLSULFATE 10 MG/10ML IV SOLN
INTRAVENOUS | Status: DC | PRN
  Administered 2013-09-28: 1 mg via INTRAVENOUS
  Administered 2013-09-28: 2 mg via INTRAVENOUS

## 2013-09-28 MED ORDER — ONDANSETRON HCL 4 MG/2ML IJ SOLN
4.0000 mg | Freq: Once | INTRAMUSCULAR | Status: AC
  Administered 2013-09-28: 4 mg via INTRAVENOUS

## 2013-09-28 MED ORDER — LACTATED RINGERS IV SOLN
INTRAVENOUS | Status: DC
  Administered 2013-09-28: 1000 mL via INTRAVENOUS
  Administered 2013-09-28: 500 mL via INTRAVENOUS

## 2013-09-28 MED ORDER — PROPOFOL 10 MG/ML IV EMUL
INTRAVENOUS | Status: AC
  Filled 2013-09-28: qty 20

## 2013-09-28 MED ORDER — ONDANSETRON HCL 4 MG/2ML IJ SOLN
INTRAMUSCULAR | Status: AC
  Filled 2013-09-28: qty 2

## 2013-09-28 MED ORDER — SODIUM CHLORIDE 0.9 % IR SOLN
Status: DC | PRN
  Administered 2013-09-28: 1000 mL

## 2013-09-28 MED ORDER — FENTANYL CITRATE 0.05 MG/ML IJ SOLN
INTRAMUSCULAR | Status: AC
  Filled 2013-09-28: qty 5

## 2013-09-28 MED ORDER — NEOSTIGMINE METHYLSULFATE 10 MG/10ML IV SOLN
INTRAVENOUS | Status: AC
  Filled 2013-09-28: qty 1

## 2013-09-28 MED ORDER — SODIUM CHLORIDE 0.9 % IJ SOLN
INTRAMUSCULAR | Status: AC
  Filled 2013-09-28: qty 10

## 2013-09-28 MED ORDER — EPHEDRINE SULFATE 50 MG/ML IJ SOLN
INTRAMUSCULAR | Status: AC
  Filled 2013-09-28: qty 1

## 2013-09-28 MED ORDER — MIDAZOLAM HCL 5 MG/5ML IJ SOLN
INTRAMUSCULAR | Status: DC | PRN
  Administered 2013-09-28: 2 mg via INTRAVENOUS

## 2013-09-28 MED ORDER — FENTANYL CITRATE 0.05 MG/ML IJ SOLN
25.0000 ug | INTRAMUSCULAR | Status: AC
  Administered 2013-09-28 (×2): 25 ug via INTRAVENOUS

## 2013-09-28 MED ORDER — LIDOCAINE HCL 1 % IJ SOLN
INTRAMUSCULAR | Status: DC | PRN
  Administered 2013-09-28: 50 mg via INTRADERMAL

## 2013-09-28 MED ORDER — CHLORHEXIDINE GLUCONATE 4 % EX LIQD: 60.0000 mL | Freq: Once | CUTANEOUS | Status: DC

## 2013-09-28 MED ORDER — LABETALOL HCL 5 MG/ML IV SOLN: 10.0000 mg | INTRAVENOUS | Status: DC | PRN

## 2013-09-28 MED ORDER — PROPOFOL 10 MG/ML IV BOLUS
INTRAVENOUS | Status: DC | PRN
  Administered 2013-09-28: 150 mg via INTRAVENOUS

## 2013-09-28 MED ORDER — FENTANYL CITRATE 0.05 MG/ML IJ SOLN
50.0000 ug | INTRAMUSCULAR | Status: DC | PRN
  Administered 2013-09-28 (×2): 50 ug via INTRAVENOUS

## 2013-09-28 MED ORDER — ROCURONIUM BROMIDE 100 MG/10ML IV SOLN
INTRAVENOUS | Status: DC | PRN
  Administered 2013-09-28: 30 mg via INTRAVENOUS
  Administered 2013-09-28: 10 mg via INTRAVENOUS

## 2013-09-28 MED ORDER — SUCCINYLCHOLINE CHLORIDE 20 MG/ML IJ SOLN
INTRAMUSCULAR | Status: DC | PRN
Start: 2013-09-28 — End: 2013-09-28
  Administered 2013-09-28: 175 mg via INTRAVENOUS

## 2013-09-28 MED ORDER — FENTANYL CITRATE 0.05 MG/ML IJ SOLN
INTRAMUSCULAR | Status: DC | PRN
  Administered 2013-09-28: 50 ug via INTRAVENOUS
  Administered 2013-09-28: 100 ug via INTRAVENOUS
  Administered 2013-09-28 (×4): 50 ug via INTRAVENOUS

## 2013-09-28 MED ORDER — METHOCARBAMOL 1000 MG/10ML IJ SOLN
500.0000 mg | Freq: Once | INTRAVENOUS | Status: AC
  Administered 2013-09-28: 500 mg via INTRAVENOUS
  Filled 2013-09-28: qty 5

## 2013-09-28 MED ORDER — GLYCOPYRROLATE 0.2 MG/ML IJ SOLN
INTRAMUSCULAR | Status: DC | PRN
  Administered 2013-09-28: 0.4 mg via INTRAVENOUS

## 2013-09-28 MED ORDER — OXYCODONE HCL 5 MG PO TABS
ORAL_TABLET | ORAL | Status: AC
  Filled 2013-09-28: qty 1

## 2013-09-28 MED ORDER — FENTANYL CITRATE 0.05 MG/ML IJ SOLN
25.0000 ug | INTRAMUSCULAR | Status: DC | PRN
  Administered 2013-09-28 (×4): 50 ug via INTRAVENOUS
  Filled 2013-09-28 (×2): qty 2

## 2013-09-28 MED ORDER — CEFAZOLIN SODIUM-DEXTROSE 2-3 GM-% IV SOLR
2.0000 g | INTRAVENOUS | Status: AC
  Administered 2013-09-28: 2 g via INTRAVENOUS
  Filled 2013-09-28: qty 50

## 2013-09-28 MED ORDER — SUCCINYLCHOLINE CHLORIDE 20 MG/ML IJ SOLN
INTRAMUSCULAR | Status: AC
  Filled 2013-09-28: qty 1

## 2013-09-28 MED ORDER — BUPIVACAINE-EPINEPHRINE (PF) 0.5% -1:200000 IJ SOLN
INTRAMUSCULAR | Status: AC
  Filled 2013-09-28: qty 60

## 2013-09-28 MED ORDER — OXYCODONE HCL 5 MG PO TABS
5.0000 mg | ORAL_TABLET | Freq: Once | ORAL | Status: AC
  Administered 2013-09-28: 5 mg via ORAL

## 2013-09-28 SURGICAL SUPPLY — 77 items
BAG HAMPER (MISCELLANEOUS) ×2 IMPLANT
BANDAGE ELASTIC 3 VELCRO NS (GAUZE/BANDAGES/DRESSINGS) ×2 IMPLANT
BANDAGE ELASTIC 4 VELCRO NS (GAUZE/BANDAGES/DRESSINGS) ×2 IMPLANT
BANDAGE ESMARK 4X12 BL STRL LF (DISPOSABLE) ×1 IMPLANT
BIT DRILL 2.5X110 QC LCP DISP (BIT) IMPLANT
BIT DRILL 2.8 (BIT)
BIT DRILL CANN QC 2.8X165 (BIT) IMPLANT
BIT DRILL QC 3.5X110 (BIT) ×2 IMPLANT
BLADE 10 SAFETY STRL DISP (BLADE) IMPLANT
BNDG COHESIVE 4X5 TAN STRL (GAUZE/BANDAGES/DRESSINGS) ×2 IMPLANT
BNDG ESMARK 4X12 BLUE STRL LF (DISPOSABLE) ×2
CHLORAPREP W/TINT 26ML (MISCELLANEOUS) ×4 IMPLANT
CLOTH BEACON ORANGE TIMEOUT ST (SAFETY) ×2 IMPLANT
COVER LIGHT HANDLE STERIS (MISCELLANEOUS) ×8 IMPLANT
CUFF TOURNIQUET SINGLE 34IN LL (TOURNIQUET CUFF) ×2 IMPLANT
CUFF TOURNIQUET SINGLE 44IN (TOURNIQUET CUFF) IMPLANT
DRAPE C-ARM FOLDED MOBILE STRL (DRAPES) ×2 IMPLANT
DRAPE PROXIMA HALF (DRAPES) ×2 IMPLANT
DRILL BIT 2.8MM (BIT)
GAUZE SPONGE 4X4 12PLY STRL (GAUZE/BANDAGES/DRESSINGS) IMPLANT
GAUZE XEROFORM 5X9 LF (GAUZE/BANDAGES/DRESSINGS) ×2 IMPLANT
GLOVE BIO SURGEON STRL SZ 6.5 (GLOVE) ×2 IMPLANT
GLOVE BIO SURGEON STRL SZ7 (GLOVE) ×2 IMPLANT
GLOVE BIOGEL PI IND STRL 7.0 (GLOVE) ×1 IMPLANT
GLOVE BIOGEL PI IND STRL 7.5 (GLOVE) ×1 IMPLANT
GLOVE BIOGEL PI IND STRL 8 (GLOVE) ×2 IMPLANT
GLOVE BIOGEL PI INDICATOR 7.0 (GLOVE) ×1
GLOVE BIOGEL PI INDICATOR 7.5 (GLOVE) ×1
GLOVE BIOGEL PI INDICATOR 8 (GLOVE) ×2
GLOVE SKINSENSE NS SZ8.0 LF (GLOVE) ×1
GLOVE SKINSENSE STRL SZ8.0 LF (GLOVE) ×1 IMPLANT
GLOVE SS N UNI LF 8.5 STRL (GLOVE) ×2 IMPLANT
GOWN STRL REUS W/TWL LRG LVL3 (GOWN DISPOSABLE) ×6 IMPLANT
GOWN STRL REUS W/TWL XL LVL3 (GOWN DISPOSABLE) ×2 IMPLANT
INST SET MINOR BONE (KITS) ×2 IMPLANT
K-WIRE 1.25 TRCR POINT 150 (WIRE)
K-WIRE 1.6X150 (WIRE) ×4
K-WIRE 2.0X150M (WIRE)
K-WIRE FX150X1.25XNS LF SS (Wire) ×2 IMPLANT
K-WIRE SMOOTH (Wire) ×2 IMPLANT
KIT ROOM TURNOVER APOR (KITS) ×2 IMPLANT
KWIRE 1.25 TRCR POINT 150 (WIRE) IMPLANT
KWIRE 1.6X150 (WIRE) ×2 IMPLANT
KWIRE 2.0X150M (WIRE) IMPLANT
KWIRE FX150X1.25XNS LF SS (Wire) ×2 IMPLANT
MANIFOLD NEPTUNE II (INSTRUMENTS) ×2 IMPLANT
NEEDLE HYPO 21X1.5 SAFETY (NEEDLE) ×2 IMPLANT
NS IRRIG 1000ML POUR BTL (IV SOLUTION) ×2 IMPLANT
PACK BASIC LIMB (CUSTOM PROCEDURE TRAY) ×2 IMPLANT
PAD ABD 5X9 TENDERSORB (GAUZE/BANDAGES/DRESSINGS) ×4 IMPLANT
PAD ARMBOARD 7.5X6 YLW CONV (MISCELLANEOUS) ×2 IMPLANT
PAD CAST 4YDX4 CTTN HI CHSV (CAST SUPPLIES) ×1 IMPLANT
PADDING CAST COTTON 4X4 STRL (CAST SUPPLIES) ×1
PADDING WEBRIL 4 STERILE (GAUZE/BANDAGES/DRESSINGS) ×2 IMPLANT
PLATE FIBULA 5H (Plate) ×2 IMPLANT
SCREW 3.5X10MM (Screw) ×8 IMPLANT
SCREW BONE 14MMX3.5MM (Screw) ×2 IMPLANT
SCREW BONE 18 (Screw) ×2 IMPLANT
SCREW BONE 3.5X16MM (Screw) ×2 IMPLANT
SCREW BONE 3.5X20MM (Screw) ×2 IMPLANT
SCREW BONE 3.5X46 (Screw) ×2 IMPLANT
SET BASIN LINEN APH (SET/KITS/TRAYS/PACK) ×2 IMPLANT
SPLINT IMMOBILIZER J 3INX20FT (CAST SUPPLIES)
SPLINT J IMMOBILIZER 3X20FT (CAST SUPPLIES) IMPLANT
SPLINT J IMMOBILIZER 4X20FT (CAST SUPPLIES) ×1 IMPLANT
SPLINT J PLASTER J 4INX20Y (CAST SUPPLIES) ×1
SPONGE GAUZE 4X4 12PLY (GAUZE/BANDAGES/DRESSINGS) ×2 IMPLANT
SPONGE LAP 18X18 X RAY DECT (DISPOSABLE) ×2 IMPLANT
STAPLER VISISTAT 35W (STAPLE) ×2 IMPLANT
SUT ETHIBOND NAB OS 4 #2 30IN (SUTURE) ×2 IMPLANT
SUT ETHILON 3 0 FSL (SUTURE) ×2 IMPLANT
SUT MON AB 0 CT1 (SUTURE) ×2 IMPLANT
SUT MON AB 2-0 CT1 36 (SUTURE) IMPLANT
SUT VIC AB 1 CT1 27 (SUTURE) ×1
SUT VIC AB 1 CT1 27XBRD ANTBC (SUTURE) ×1 IMPLANT
SYR 30ML LL (SYRINGE) ×2 IMPLANT
SYR BULB IRRIGATION 50ML (SYRINGE) ×2 IMPLANT

## 2013-09-28 NOTE — Anesthesia Postprocedure Evaluation (Signed)
  Anesthesia Post-op Note  Patient: [name]  Procedure(s) Performed: Procedure(s): OPEN REDUCTION INTERNAL FIXATION (ORIF) LEFT ANKLE (Left)  Patient Location: PACU  Anesthesia Type:General  Level of Consciousness: awake, alert , oriented and patient cooperative  Airway and Oxygen Therapy: Patient Spontanous Breathing  Post-op Pain: 4 /10, moderate  Post-op Assessment: Post-op Vital signs reviewed, Patient's Cardiovascular Status Stable, Respiratory Function Stable, Patent Airway, No signs of Nausea or vomiting and Adequate PO intake  Post-op Vital Signs: Reviewed and stable  Last Vitals:  Filed Vitals:   09/28/13 1050  BP: 122/86  Pulse:   Temp:   Resp: 18    Complications: No apparent anesthesia complications

## 2013-09-28 NOTE — Brief Op Note (Addendum)
09/28/2013  12:56 PM  PATIENT:  [name]  43 y.o. female  PRE-OPERATIVE DIAGNOSIS:  trimalleolar ankle fracture dislocation  POST-OPERATIVE DIAGNOSIS:  trimalleolar ankle fracture dislocation  PROCEDURE:  Procedure(s): OPEN REDUCTION INTERNAL FIXATION (ORIF) LEFT ANKLE (Left)  SURGEON:  Surgeon(s) and Role:    * Vickki HearingStanley E Orlandus Borowski, MD - Primary  PHYSICIAN ASSISTANT:   ASSISTANTS: [place] NationBetty Ashley   ANESTHESIA:   general  EBL:  Total I/O In: 800 [I.V.:800] Out: 300 [Urine:300]  BLOOD ADMINISTERED:none  DRAINS: none   LOCAL MEDICATIONS USED:  MARCAINE     SPECIMEN:  No Specimen  DISPOSITION OF SPECIMEN:  N/A  COUNTS:  YES  TOURNIQUET:   Total Tourniquet Time Documented: Thigh (Left) - 77 minutes Total: Thigh (Left) - 77 minutes   DICTATION: .Reubin Milanragon Dictation  PLAN OF CARE: Pending condition in PACU  PATIENT DISPOSITION:  PACU - hemodynamically stable.   Delay start of Pharmacological VTE agent (>24hrs) due to surgical blood loss or risk of bleeding: not applicable  Surgical findings severely comminuted lateral malleolus fracture. Rupture anterior talofibular ligament. Rupture syndesmosis. Medial malleolus fracture.  Details of procedure The patient was identified in the preop area the left ankle was marked as a surgical site the chart was reviewed and updated. She was taken to the operating room given appropriate antibiotics based on her weight of 140 pounds. She had general anesthetic. She was prepped and draped sterilely. Her left leg was confirmed as a surgical site primarily at the ankle by performing a timeout. After examination of the limb with a 4 inch Esmarch the tourniquet was elevated 300 mm of mercury. A longitudinal incision was then made over the fibula and full-thickness skin flaps were created by carrying the incision down to the fibula periosteum. Subperiosteal dissection expose the fracture which was comminuted 3 pieces. The anterior  talofibular ligament was ruptured.  The syndesmosis was ruptured as well.  After irrigation of the fracture and the fracture fragments were anatomically reduced and held with clamps. Radiographs confirmed reduction using AP and lateral and mortise views. A Stryker plate was then placed using AO technique. Radiographs were taken along the way to ensure maintenance of reduction  At this point a bone hook was used to test the syndesmosis and it was ruptured confirming the visual rupture.  A syndesmosis screw was placed.  The wound is irrigated. A sponge was placed in a.  The lateral malleolus was addressed with open reduction by performing incision over the lateral malleolus creating full-thickness skin flaps by carrying it down to bone and extending across the deltoid ligament.  2 K wires were placed and checked by x-ray. This created an anatomic reduction of the medial malleolus. Mortise views and lateral views confirmed hardware position  We then checked the syndesmosis again with the bone. It was stable.  We irrigated the wound on the medial side and closed it with 3-0 nylon interrupted sutures. We closed the lateral side with 0 Monocryl and staples. We injected both sides with 30 cc of Marcaine with epinephrine  Placed the patient in a posterior splint.  She was extubated taken recovery room in stable condition  We will have to come back at some point in taking syndesmosis screw out at about 12 weeks  She'll be nonweightbearing full 12 weeks.

## 2013-09-28 NOTE — Anesthesia Preprocedure Evaluation (Addendum)
Anesthesia Evaluation  Patient identified by MRN, date of birth, ID band Patient awake    Reviewed: Allergy & Precautions, H&P , NPO status , Patient's Chart, lab work & pertinent test results  Airway Mallampati: III TM Distance: >3 FB     Dental  (+) Teeth Intact   Pulmonary asthma , Current Smoker,  breath sounds clear to auscultation        Cardiovascular hypertension (hx non-compliance / just restarted meds), Pt. on medications Rhythm:Regular Rate:Normal     Neuro/Psych    GI/Hepatic negative GI ROS,   Endo/Other    Renal/GU      Musculoskeletal   Abdominal   Peds  Hematology   Anesthesia Other Findings   Reproductive/Obstetrics                          Anesthesia Physical Anesthesia Plan  ASA: II  Anesthesia Plan: General   Post-op Pain Management:    Induction: Intravenous  Airway Management Planned: Oral ETT  Additional Equipment:   Intra-op Plan:   Post-operative Plan: Extubation in OR  Informed Consent: I have reviewed the patients History and Physical, chart, labs and discussed the procedure including the risks, benefits and alternatives for the proposed anesthesia with the patient or authorized representative who has indicated his/her understanding and acceptance.     Plan Discussed with:   Anesthesia Plan Comments:         Anesthesia Quick Evaluation

## 2013-09-28 NOTE — Anesthesia Procedure Notes (Signed)
Procedure Name: Intubation Date/Time: 09/28/2013 11:07 AM Performed by: Despina HiddenIDACAVAGE, Dvaughn Fickle J Pre-anesthesia Checklist: Emergency Drugs available, Suction available, Patient being monitored and Patient identified Patient Re-evaluated:Patient Re-evaluated prior to inductionOxygen Delivery Method: Circle system utilized Preoxygenation: Pre-oxygenation with 100% oxygen Intubation Type: IV induction, Rapid sequence and Cricoid Pressure applied Ventilation: Oral airway inserted - appropriate to patient size Laryngoscope Size: Mac and 3 Grade View: Grade I Tube type: Oral Tube size: 7.0 mm Number of attempts: 1 Airway Equipment and Method: Stylet Placement Confirmation: ETT inserted through vocal cords under direct vision,  positive ETCO2 and breath sounds checked- equal and bilateral Secured at: 22 cm Tube secured with: Tape Dental Injury: Teeth and Oropharynx as per pre-operative assessment

## 2013-09-28 NOTE — Interval H&P Note (Signed)
History and Physical Interval Note:  09/28/2013 10:48 AM  [name]  has presented today for surgery, with the diagnosis of trimalleolar ankle fracture dislocation  The various methods of treatment have been discussed with the patient and family. After consideration of risks, benefits and other options for treatment, the patient has consented to  Procedure(s): OPEN REDUCTION INTERNAL FIXATION (ORIF) ANKLE FRACTURE (Left) as a surgical intervention .  The patient's history has been reviewed, patient examined, no change in status, stable for surgery.  I have reviewed the patient's chart and labs.  Questions were answered to the patient's satisfaction.     Fuller CanadaStanley Harrison

## 2013-09-28 NOTE — Op Note (Signed)
09/28/2013  12:56 PM  PATIENT:  [name]  42 y.o. female  PRE-OPERATIVE DIAGNOSIS:  trimalleolar ankle fracture dislocation  POST-OPERATIVE DIAGNOSIS:  trimalleolar ankle fracture dislocation  PROCEDURE:  Procedure(s): OPEN REDUCTION INTERNAL FIXATION (ORIF) LEFT ANKLE (Left)  SURGEON:  Surgeon(s) and Role:    * Lilyana Lippman E Ethlyn Alto, MD - Primary  PHYSICIAN ASSISTANT:   ASSISTANTS: Betty Ashley   ANESTHESIA:   general  EBL:  Total I/O In: 800 [I.V.:800] Out: 300 [Urine:300]  BLOOD ADMINISTERED:none  DRAINS: none   LOCAL MEDICATIONS USED:  MARCAINE     SPECIMEN:  No Specimen  DISPOSITION OF SPECIMEN:  N/A  COUNTS:  YES  TOURNIQUET:   Total Tourniquet Time Documented: Thigh (Left) - 77 minutes Total: Thigh (Left) - 77 minutes   DICTATION: .Dragon Dictation  PLAN OF CARE: Pending condition in PACU  PATIENT DISPOSITION:  PACU - hemodynamically stable.   Delay start of Pharmacological VTE agent (>24hrs) due to surgical blood loss or risk of bleeding: not applicable  Surgical findings severely comminuted lateral malleolus fracture. Rupture anterior talofibular ligament. Rupture syndesmosis. Medial malleolus fracture.  Details of procedure The patient was identified in the preop area the left ankle was marked as a surgical site the chart was reviewed and updated. She was taken to the operating room given appropriate antibiotics based on her weight of 140 pounds. She had general anesthetic. She was prepped and draped sterilely. Her left leg was confirmed as a surgical site primarily at the ankle by performing a timeout. After examination of the limb with a 4 inch Esmarch the tourniquet was elevated 300 mm of mercury. A longitudinal incision was then made over the fibula and full-thickness skin flaps were created by carrying the incision down to the fibula periosteum. Subperiosteal dissection expose the fracture which was comminuted 3 pieces. The anterior  talofibular ligament was ruptured.  The syndesmosis was ruptured as well.  After irrigation of the fracture and the fracture fragments were anatomically reduced and held with clamps. Radiographs confirmed reduction using AP and lateral and mortise views. A Stryker plate was then placed using AO technique. Radiographs were taken along the way to ensure maintenance of reduction  At this point a bone hook was used to test the syndesmosis and it was ruptured confirming the visual rupture.  A syndesmosis screw was placed.  The wound is irrigated. A sponge was placed in a.  The lateral malleolus was addressed with open reduction by performing incision over the lateral malleolus creating full-thickness skin flaps by carrying it down to bone and extending across the deltoid ligament.  2 K wires were placed and checked by x-ray. This created an anatomic reduction of the medial malleolus. Mortise views and lateral views confirmed hardware position  We then checked the syndesmosis again with the bone. It was stable.  We irrigated the wound on the medial side and closed it with 3-0 nylon interrupted sutures. We closed the lateral side with 0 Monocryl and staples. We injected both sides with 30 cc of Marcaine with epinephrine  Placed the patient in a posterior splint.  She was extubated taken recovery room in stable condition  We will have to come back at some point in taking syndesmosis screw out at about 12 weeks  She'll be nonweightbearing full 12 weeks.  

## 2013-09-28 NOTE — Transfer of Care (Signed)
Immediate Anesthesia Transfer of Care Note  Patient: [name]  Procedure(s) Performed: Procedure(s): OPEN REDUCTION INTERNAL FIXATION (ORIF) LEFT ANKLE (Left)  Patient Location: PACU  Anesthesia Type:General  Level of Consciousness: awake, alert  and patient cooperative  Airway & Oxygen Therapy: Patient Spontanous Breathing and Patient connected to face mask oxygen  Post-op Assessment: Report given to PACU RN, Post -op Vital signs reviewed and stable and Patient moving all extremities  Post vital signs: Reviewed and stable  Complications: No apparent anesthesia complications

## 2013-09-28 NOTE — Telephone Encounter (Addendum)
Surgery completed today 09/28/13 as scheduled; as noted, no pre-authorization required for Medicaid, out-patient, procedure CPT 29822. Additional CPT [id number], [id number], no pre-authorization required.

## 2013-09-29 ENCOUNTER — Encounter (HOSPITAL_COMMUNITY): Payer: Self-pay | Admitting: Orthopedic Surgery

## 2013-10-03 ENCOUNTER — Ambulatory Visit (INDEPENDENT_AMBULATORY_CARE_PROVIDER_SITE_OTHER): Payer: Medicaid Other | Admitting: Orthopedic Surgery

## 2013-10-03 ENCOUNTER — Encounter: Payer: Self-pay | Admitting: Orthopedic Surgery

## 2013-10-03 VITALS — BP 123/83 | Ht 61.0 in | Wt 140.0 lb

## 2013-10-03 DIAGNOSIS — S82851D Displaced trimalleolar fracture of right lower leg, subsequent encounter for closed fracture with routine healing: Secondary | ICD-10-CM

## 2013-10-03 DIAGNOSIS — IMO0001 Reserved for inherently not codable concepts without codable children: Secondary | ICD-10-CM

## 2013-10-03 MED ORDER — OXYCODONE-ACETAMINOPHEN 10-325 MG PO TABS: 1.0000 | ORAL_TABLET | ORAL | Status: DC | PRN

## 2013-10-03 NOTE — Progress Notes (Addendum)
Chief Complaint  Patient presents with  . Follow-up    post op #1 Left ankle, DOS 09/28/13   BP 123/83  Ht  (1.549 m)  Wt 140 lb (63.504 kg)  BMI 26.47 kg/m2  Status post open reduction internal fixation trimalleolar ankle fracture status post fracture dislocation. Syndesmosis screw applied. Dressing change. Wound clean. Posterior splint reapplied. Followup next week staple sutures out after x-rays.  Meds ordered this encounter  Medications  . oxyCODONE-acetaminophen (PERCOCET) 10-325 MG per tablet    Sig: Take 1 tablet by mouth every 4 (four) hours as needed for pain.    Dispense:  84 tablet    Refill:  0

## 2013-10-11 ENCOUNTER — Ambulatory Visit (INDEPENDENT_AMBULATORY_CARE_PROVIDER_SITE_OTHER): Payer: Medicaid Other

## 2013-10-11 ENCOUNTER — Ambulatory Visit (INDEPENDENT_AMBULATORY_CARE_PROVIDER_SITE_OTHER): Payer: Self-pay | Admitting: Orthopedic Surgery

## 2013-10-11 VITALS — BP 120/80 | Ht 61.0 in | Wt 140.0 lb

## 2013-10-11 DIAGNOSIS — S82892D Other fracture of left lower leg, subsequent encounter for closed fracture with routine healing: Secondary | ICD-10-CM

## 2013-10-11 DIAGNOSIS — S8290XD Unspecified fracture of unspecified lower leg, subsequent encounter for closed fracture with routine healing: Secondary | ICD-10-CM

## 2013-10-11 NOTE — Progress Notes (Signed)
Postop visit followup visit  Chief Complaint  Patient presents with  . Follow-up    post op #2, ORIF, Left ankle, xray and remove sutures/staples, DOS 09/28/13    BP 120/80  Ht  (1.549 m)  Wt 140 lb (63.504 kg)  BMI 26.47 kg/m2  The sutures and staples were removed the wounds are clean dry and intact she has several blisters from prior to surgery that have now resolved with some eschar formation.  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  No orders of the defined types were placed in this encounter.

## 2013-10-11 NOTE — Patient Instructions (Signed)
NONWEIGHTBEARING

## 2013-10-13 ENCOUNTER — Telehealth: Payer: Self-pay | Admitting: Orthopedic Surgery

## 2013-10-13 ENCOUNTER — Other Ambulatory Visit: Payer: Self-pay | Admitting: *Deleted

## 2013-10-13 MED ORDER — OXYCODONE-ACETAMINOPHEN 10-325 MG PO TABS: 1.0000 | ORAL_TABLET | ORAL | Status: DC | PRN

## 2013-10-13 NOTE — Telephone Encounter (Signed)
[name] wants a prescription for Oxycodone 10/325

## 2013-10-13 NOTE — Telephone Encounter (Signed)
Prescription available for pick up 

## 2013-10-20 ENCOUNTER — Ambulatory Visit (INDEPENDENT_AMBULATORY_CARE_PROVIDER_SITE_OTHER): Payer: Medicaid Other | Admitting: Orthopedic Surgery

## 2013-10-20 VITALS — BP 109/74 | Ht 61.0 in | Wt 140.0 lb

## 2013-10-20 DIAGNOSIS — IMO0001 Reserved for inherently not codable concepts without codable children: Secondary | ICD-10-CM

## 2013-10-20 DIAGNOSIS — S82851D Displaced trimalleolar fracture of right lower leg, subsequent encounter for closed fracture with routine healing: Secondary | ICD-10-CM

## 2013-10-20 NOTE — Progress Notes (Signed)
Chief Complaint  Patient presents with  . Follow-up    2 week recheck/recast left ankle, post op DOS 09/28/13   Postop day 22 status post open treatment internal fixation left ankle including syndesmotic screw fixation for fracture dislocation  Patient comes in for serial casting  Cast was removed was applied with the foot in more dorsiflexed position  Continue nonweightbearing followup 2 weeks for cast off and recasting

## 2013-10-21 ENCOUNTER — Ambulatory Visit: Payer: Medicaid Other

## 2013-10-24 ENCOUNTER — Ambulatory Visit: Payer: Medicaid Other

## 2013-10-25 ENCOUNTER — Other Ambulatory Visit: Payer: Self-pay | Admitting: *Deleted

## 2013-10-25 ENCOUNTER — Telehealth: Payer: Self-pay | Admitting: Orthopedic Surgery

## 2013-10-25 MED ORDER — OXYCODONE-ACETAMINOPHEN 10-325 MG PO TABS: 1.0000 | ORAL_TABLET | ORAL | Status: DC | PRN

## 2013-10-25 NOTE — Telephone Encounter (Signed)
Patient called, requests refill, pain medication:  oxyCODONE-acetaminophen (PERCOCET) 10-325 MG per tablet [[id number]]. Her next scheduled appointment is 11/03/13. Her ph# is [phone]

## 2013-10-25 NOTE — Telephone Encounter (Signed)
Prescription available for pick up, patient aware 

## 2013-10-26 ENCOUNTER — Encounter: Payer: Self-pay | Admitting: Adult Health

## 2013-10-26 ENCOUNTER — Ambulatory Visit (INDEPENDENT_AMBULATORY_CARE_PROVIDER_SITE_OTHER): Payer: Medicaid Other | Admitting: Adult Health

## 2013-10-26 DIAGNOSIS — Z3042 Encounter for surveillance of injectable contraceptive: Secondary | ICD-10-CM

## 2013-10-26 DIAGNOSIS — Z3202 Encounter for pregnancy test, result negative: Secondary | ICD-10-CM

## 2013-10-26 DIAGNOSIS — Z3049 Encounter for surveillance of other contraceptives: Secondary | ICD-10-CM

## 2013-10-26 LAB — POCT URINE PREGNANCY: Preg Test, Ur: NEGATIVE

## 2013-10-26 MED ORDER — MEDROXYPROGESTERONE ACETATE 150 MG/ML IM SUSP
150.0000 mg | Freq: Once | INTRAMUSCULAR | Status: AC
  Administered 2013-10-26: 150 mg via INTRAMUSCULAR

## 2013-11-03 ENCOUNTER — Encounter: Payer: Self-pay | Admitting: Orthopedic Surgery

## 2013-11-03 ENCOUNTER — Ambulatory Visit (INDEPENDENT_AMBULATORY_CARE_PROVIDER_SITE_OTHER): Payer: Self-pay | Admitting: Orthopedic Surgery

## 2013-11-03 ENCOUNTER — Ambulatory Visit (INDEPENDENT_AMBULATORY_CARE_PROVIDER_SITE_OTHER): Payer: Medicaid Other

## 2013-11-03 ENCOUNTER — Other Ambulatory Visit: Payer: Self-pay | Admitting: *Deleted

## 2013-11-03 VITALS — BP 146/93 | Ht 61.0 in | Wt 140.0 lb

## 2013-11-03 DIAGNOSIS — S82899A Other fracture of unspecified lower leg, initial encounter for closed fracture: Secondary | ICD-10-CM

## 2013-11-03 DIAGNOSIS — S8290XD Unspecified fracture of unspecified lower leg, subsequent encounter for closed fracture with routine healing: Secondary | ICD-10-CM

## 2013-11-03 DIAGNOSIS — S82892A Other fracture of left lower leg, initial encounter for closed fracture: Secondary | ICD-10-CM

## 2013-11-03 DIAGNOSIS — T8131XA Disruption of external operation (surgical) wound, not elsewhere classified, initial encounter: Secondary | ICD-10-CM

## 2013-11-03 DIAGNOSIS — S82892D Other fracture of left lower leg, subsequent encounter for closed fracture with routine healing: Secondary | ICD-10-CM

## 2013-11-03 MED ORDER — SULFAMETHOXAZOLE-TMP DS 800-160 MG PO TABS: 1.0000 | ORAL_TABLET | Freq: Two times a day (BID) | ORAL | Status: DC

## 2013-11-03 NOTE — Patient Instructions (Signed)
Surgery 11/09/13

## 2013-11-03 NOTE — Progress Notes (Signed)
Chief Complaint  Patient presents with  . Follow-up    2 week recheck Left ankle, cast change, DOS 09/28/13   post op DOS 09/28/13    status post open treatment internal fixation left ankle including syndesmotic screw fixation for fracture dislocation  She is basically 36 days postop; postop week #5 status post open treatment internal fixation of the left ankle syndesmosis screw fixation she has a wound problem. She has wound drainage. It seems to me that she got water under the cast and she also confirms that she was putting something down in a cast she has 2 open areas of the wound with clear serous drainage. X-rays today show no change in hardware position of the ankle. She was undergoing serial casting to get her foot in neutral position we will have to abort that at this point and take her to surgery for irrigation culture and packing of the wound.

## 2013-11-04 ENCOUNTER — Encounter (HOSPITAL_COMMUNITY): Payer: Self-pay | Admitting: Pharmacy Technician

## 2013-11-07 ENCOUNTER — Encounter (HOSPITAL_COMMUNITY): Payer: Self-pay

## 2013-11-07 ENCOUNTER — Encounter (HOSPITAL_COMMUNITY)
Admission: RE | Admit: 2013-11-07 | Discharge: 2013-11-07 | Disposition: A | Discharge status: Home / Self Care | Payer: Medicaid Other | Source: Ambulatory Visit | Attending: Orthopedic Surgery | Admitting: Orthopedic Surgery

## 2013-11-07 DIAGNOSIS — T8140XA Infection following a procedure, unspecified, initial encounter: Secondary | ICD-10-CM | POA: Diagnosis present

## 2013-11-07 DIAGNOSIS — Z01812 Encounter for preprocedural laboratory examination: Secondary | ICD-10-CM | POA: Diagnosis present

## 2013-11-07 DIAGNOSIS — IMO0001 Reserved for inherently not codable concepts without codable children: Secondary | ICD-10-CM | POA: Insufficient documentation

## 2013-11-07 DIAGNOSIS — Z01810 Encounter for preprocedural cardiovascular examination: Secondary | ICD-10-CM | POA: Insufficient documentation

## 2013-11-07 LAB — HCG, SERUM, QUALITATIVE: Preg, Serum: NEGATIVE

## 2013-11-07 LAB — HEMOGLOBIN AND HEMATOCRIT, BLOOD
HEMATOCRIT: 44.9 % (ref 36.0–46.0)
HEMOGLOBIN: 15.8 g/dL — AB (ref 12.0–15.0)

## 2013-11-07 LAB — BASIC METABOLIC PANEL
Anion gap: 18 — ABNORMAL HIGH (ref 5–15)
BUN: 11 mg/dL (ref 6–23)
CHLORIDE: 102 meq/L (ref 96–112)
CO2: 19 mEq/L (ref 19–32)
Calcium: 9.5 mg/dL (ref 8.4–10.5)
Creatinine, Ser: 1.27 mg/dL — ABNORMAL HIGH (ref 0.50–1.10)
GFR calc non Af Amer: 52 mL/min — ABNORMAL LOW (ref >90)
GFR, EST AFRICAN AMERICAN: 60 mL/min — AB (ref >90)
Glucose, Bld: 97 mg/dL (ref 70–99)
POTASSIUM: 3.3 meq/L — AB (ref 3.7–5.3)
Sodium: 139 mEq/L (ref 137–147)

## 2013-11-07 NOTE — Patient Instructions (Signed)
[name]  11/07/2013   Your procedure is scheduled on:   11/09/2013  Report to [facility] at  700  AM.  Call this number if you have problems the morning of surgery: [phone]   Remember:   Do not eat food or drink liquids after midnight.   Take these medicines the morning of surgery with A SIP OF WATER:  Norvasc, chlorthalidone, percocet   Do not wear jewelry, make-up or nail polish.  Do not wear lotions, powders, or perfumes.   Do not shave 48 hours prior to surgery. Men may shave face and neck.  Do not bring valuables to the hospital.  [facility] is not responsible for any belongings or valuables.               Contacts, dentures or bridgework may not be worn into surgery.  Leave suitcase in the car. After surgery it may be brought to your room.  For patients admitted to the hospital, discharge time is determined by your treatment team.               Patients discharged the day of surgery will not be allowed to drive  home.  Name and phone number of your driver: family  Special Instructions: Shower using CHG 2 nights before surgery and the night before surgery.  If you shower the day of surgery use CHG.  Use special wash - you have one bottle of CHG for all showers.  You should use approximately 1/3 of the bottle for each shower.   Please read over the following fact sheets that you were given: Pain Booklet, Coughing and Deep Breathing, Surgical Site Infection Prevention, Anesthesia Post-op Instructions and Care and Recovery After Surgery Wound Debridement Wound debridement is a procedure used to remove dead tissue and contaminated substances from a wound. A wound must be clean to heal. It also must get a good supply of blood. Anything that is stopping this must be taken out of the wound. This could be dead tissue, scar tissue, fluid buildup, or debris from outside of the body. Wounds that are not cleaned by debridement heal slowly or not at all. They can become  infected. Any infection in the tissue can also spread to nearby areas or to other parts of the body through the blood. Wound debridement can be done through a surgical procedure or various other methods. Debridement is sometimes done to get a sample of tissue from the wound. The tissue can be checked under a microscope or sent to a lab for testing.  LET YOUR CAREGIVER KNOW ABOUT:   Any allergies you have.  All medicines you are taking, including vitamins, steroids, herbs, eyedrops, and over-the-counter medicines and creams.   Previous problems you or members of your family have had with the use of anesthetics.   Any blood disorders you have had.   Previous surgeries you have had.   Other health problems you have.  RISKS AND COMPLICATIONS  Generally, wound debridement is a safe procedure. However, as with any medical procedure, complications can occur. Possible complications include:  Bleeding that does not stop.   Infection.   Damage to nerves, blood vessels, or healthy tissue inside the wound.   Pain.   Lack of healing. BEFORE THE PROCEDURE   The caregiver will check the wound for signs of healing or infection. A measurement of the wound will be taken, including how deep it is. A metal tool (probe) may be  used. Blood tests may be done to check for infection.   If you will be given medicine to make you sleep through the procedure (general anesthetic), do not eat or drink anything for at least 6 hours before the procedure. Ask your caregiver if it is okay to have a sip of water with any needed medicine.   Make plans to have someone drive you home after the procedure. Also, make sure someone can stay with you for a few days.  PROCEDURE  The following methods may be used alone or in combination. Surgical debridement:  Small monitors may be placed on your body. They are used to check your heart, blood pressure, and oxygen level.   You may be given medicine through an  intravenous (IV) access tube in your hand or arm.   You might be given medicine to help you relax (sedative).   You may be given medicine to numb the area around the wound (local anesthetic). If the wound is deep or wide, you may be given general anesthetic to make you sleep through the procedure.   Once you are asleep or the wound area is numb, the wound may be washed with a sterile saltwater solution.   Scissors, surgical knives (scalpels), and surgical tweezers (forceps) will be used to remove dead or dying tissue. Any other material that should not be in the wound will also be taken out.   After the tissue and other material have been removed from the wound, the wound will be washed again.   A bandage (dressing) may be placed over the wound.  Mechanical debridement: Mechanical debridement may involve various techniques:  A dressing may be used to pull off dead tissue. A moist dressing is placed over the wound. It is left in place until it is dry. When the dressing is lifted off, this lifts away the dead tissue.   Whirlpool baths may be used to flush the wound with forceful streams of hot water.   The wound may be flushed with sterile solution.  Surgical instruments that use water under high pressure may be used to clean the wound. Chemical debridement:  A chemical medicine is put on the wound. The aim is to dissolve dead or dying tissue. Ointments may also be used. Autolytic debridement:  A special dressing is used to trap moisture inside the wound. The goal is for the wound to heal naturally under the dressing. Healing takes longer with this treatment. AFTER THE PROCEDURE   If a local anesthetic is used, you will be allowed to go home as soon as you are ready. If a general anesthetic is used, you will be taken to a recovery area until you are stable. Your blood pressure and pulse will be checked often. You may continue to get fluids through the IV tube for a while. Once  you are stable, you may be able to go home, or you may need to stay in the hospital overnight.Your caregiver will decide when you can go home.   You may feel some pain. You will likely be given medicine for pain.  Before you go home, make sure you know how to care for the wound. This includes knowing when the dressing should be changed and how to change it.   Set up a follow-up appointment before leaving. Document Released: 04/23/2009 Document Revised: 01/14/2012 Document Reviewed: 10/15/2011 [facility] Patient Information 2015 [place], [place]. This information is not intended to replace advice given to you by your health care provider.  Make sure you discuss any questions you have with your health care provider. PATIENT INSTRUCTIONS POST-ANESTHESIA  IMMEDIATELY FOLLOWING SURGERY:  Do not drive or operate machinery for the first twenty four hours after surgery.  Do not make any important decisions for twenty four hours after surgery or while taking narcotic pain medications or sedatives.  If you develop intractable nausea and vomiting or a severe headache please notify your doctor immediately.  FOLLOW-UP:  Please make an appointment with your surgeon as instructed. You do not need to follow up with anesthesia unless specifically instructed to do so.  WOUND CARE INSTRUCTIONS (if applicable):  Keep a dry clean dressing on the anesthesia/puncture wound site if there is drainage.  Once the wound has quit draining you may leave it open to air.  Generally you should leave the bandage intact for twenty four hours unless there is drainage.  If the epidural site drains for more than 36-48 hours please call the anesthesia department.  QUESTIONS?:  Please feel free to call your physician or the hospital operator if you have any questions, and they will be happy to assist you.

## 2013-11-07 NOTE — Pre-Procedure Instructions (Signed)
Patient given information to sign up for my chart at home. 

## 2013-11-08 NOTE — H&P (Addendum)
[name] is an 43 y.o. female.   Chief Complaint: Drainage from ankle HPI: This patient is 43 years old she sustained closed fracture dislocation of the left ankle in August of 2015. She [place] up in a creek with her ankle dislocated crawl to the top of the bank and was found early the next day after being in a creek for several hours. Closed manipulation of the ankle in the emergency room and then eventually had open reduction internal fixation approximately 2 weeks later. In the postoperative period she was doing well until approximately one week ago at her postop visit we noticed drainage. X-rays were obtained there was no complication of the hardware.  The ankle and skin looks like the patient allowed order to get underneath and she does admit to scratching her skin with a sharp object despite being warned not to do so.  She presents for irrigation debridement open packing most likely of the wound.   Past Medical History  Diagnosis Date  . Hypertension   . Drug abuse   . Asthma     as child  . Anemia     Past Surgical History  Procedure Laterality Date  . Cholecystectomy    . Orif ankle fracture Left 09/28/2013    Procedure: OPEN REDUCTION INTERNAL FIXATION (ORIF) LEFT ANKLE;  Surgeon: Carole Civil, MD;  Location: [facility];  Service: Orthopedics;  Laterality: Left;    Family History  Problem Relation Age of Onset  . Hypertension Father    Social History:  reports that she has been smoking Cigarettes.  She has a 17 pack-year smoking history. She has never used smokeless tobacco. She reports that she does not drink alcohol or use illicit drugs.  Allergies: No Known Allergies  No prescriptions prior to admission    Results for orders placed during the hospital encounter of 11/07/13 (from the past 48 hour(s))  HEMOGLOBIN AND HEMATOCRIT, BLOOD     Status: Abnormal   Collection Time    11/07/13 11:15 AM      Result Value Ref Range   Hemoglobin 15.8 (*) 12.0 - 15.0 g/dL    HCT 44.9  36.0 - 41.7 %  BASIC METABOLIC PANEL     Status: Abnormal   Collection Time    11/07/13 11:15 AM      Result Value Ref Range   Sodium 139  137 - 147 mEq/L   Potassium 3.3 (*) 3.7 - 5.3 mEq/L   Chloride 102  96 - 112 mEq/L   CO2 19  19 - 32 mEq/L   Glucose, Bld 97  70 - 99 mg/dL   BUN 11  6 - 23 mg/dL   Creatinine, Ser 1.27 (*) 0.50 - 1.10 mg/dL   Calcium 9.5  8.4 - 10.5 mg/dL   GFR calc non Af Amer 52 (*) >90 mL/min   GFR calc Af Amer 60 (*) >90 mL/min   Comment: (NOTE)     The eGFR has been calculated using the CKD EPI equation.     This calculation has not been validated in all clinical situations.     eGFR's persistently <90 mL/min signify possible Chronic Kidney     Disease.   Anion gap 18 (*) 5 - 15  HCG, SERUM, QUALITATIVE     Status: None   Collection Time    11/07/13 11:15 AM      Result Value Ref Range   Preg, Serum NEGATIVE  NEGATIVE   No results found.  ROS  Negative review of systems There were no vitals taken for this visit. Physical Exam  Overall her appearance is normal. She is oriented x3. Mood and affect are normal. She walks with crutches nonweightbearing.  Her upper extremities are normal. Left lower extremity is normal.  Right ankle medial incision healed very well lateral incision has 2 areas of open drainage is serous drainage. She has poor ankle range of motion with tight Achilles tendon. No instability. Muscle tone normal. Skin as stated. Pulses normal lymph nodes normal hyper sensation indicating most likely some neurologic injury at the time of the accident. No pathologic reflexes her balance is good on crutches   Assessment/Plan Wound drainage probable infection left ankle status post open treatment internal fixation.  Arther Abbott 11/08/2013, 4:54 PM

## 2013-11-09 ENCOUNTER — Telehealth: Payer: Self-pay | Admitting: Orthopedic Surgery

## 2013-11-09 ENCOUNTER — Ambulatory Visit (HOSPITAL_COMMUNITY)
Admission: RE | Admit: 2013-11-09 | Discharge: 2013-11-09 | Disposition: A | Discharge status: Home / Self Care | Payer: Medicaid Other | Source: Ambulatory Visit | Attending: Orthopedic Surgery | Admitting: Orthopedic Surgery

## 2013-11-09 ENCOUNTER — Ambulatory Visit (HOSPITAL_COMMUNITY): Payer: Medicaid Other | Admitting: Anesthesiology

## 2013-11-09 ENCOUNTER — Encounter (HOSPITAL_COMMUNITY)
Admission: RE | Disposition: A | Discharge status: Home / Self Care | Payer: Self-pay | Source: Ambulatory Visit | Attending: Orthopedic Surgery

## 2013-11-09 ENCOUNTER — Encounter (HOSPITAL_COMMUNITY): Payer: Self-pay | Admitting: Orthopedic Surgery

## 2013-11-09 ENCOUNTER — Encounter (HOSPITAL_COMMUNITY): Payer: Medicaid Other | Admitting: Anesthesiology

## 2013-11-09 DIAGNOSIS — Y9210 Unspecified residential institution as the place of occurrence of the external cause: Secondary | ICD-10-CM | POA: Diagnosis not present

## 2013-11-09 DIAGNOSIS — IMO0001 Reserved for inherently not codable concepts without codable children: Secondary | ICD-10-CM

## 2013-11-09 DIAGNOSIS — Y831 Surgical operation with implant of artificial internal device as the cause of abnormal reaction of the patient, or of later complication, without mention of misadventure at the time of the procedure: Secondary | ICD-10-CM | POA: Insufficient documentation

## 2013-11-09 DIAGNOSIS — T8140XA Infection following a procedure, unspecified, initial encounter: Secondary | ICD-10-CM | POA: Insufficient documentation

## 2013-11-09 DIAGNOSIS — T8149XA Infection following a procedure, other surgical site, initial encounter: Secondary | ICD-10-CM

## 2013-11-09 DIAGNOSIS — S82852D Displaced trimalleolar fracture of left lower leg, subsequent encounter for closed fracture with routine healing: Secondary | ICD-10-CM

## 2013-11-09 HISTORY — PX: INCISION AND DRAINAGE: SHX5863

## 2013-11-09 SURGERY — INCISION AND DRAINAGE
Anesthesia: General | Laterality: Left

## 2013-11-09 MED ORDER — ALBUTEROL SULFATE (2.5 MG/3ML) 0.083% IN NEBU
INHALATION_SOLUTION | RESPIRATORY_TRACT | Status: AC
  Filled 2013-11-09: qty 3

## 2013-11-09 MED ORDER — FENTANYL CITRATE 0.05 MG/ML IJ SOLN
INTRAMUSCULAR | Status: AC
  Filled 2013-11-09: qty 5

## 2013-11-09 MED ORDER — MIDAZOLAM HCL 2 MG/2ML IJ SOLN
INTRAMUSCULAR | Status: AC
  Filled 2013-11-09: qty 2

## 2013-11-09 MED ORDER — CHLORHEXIDINE GLUCONATE 4 % EX LIQD: 60.0000 mL | Freq: Once | CUTANEOUS | Status: DC

## 2013-11-09 MED ORDER — LIDOCAINE HCL (PF) 1 % IJ SOLN
INTRAMUSCULAR | Status: AC
  Filled 2013-11-09: qty 5

## 2013-11-09 MED ORDER — SODIUM CHLORIDE 0.9 % IR SOLN
Status: DC | PRN
  Administered 2013-11-09: 1000 mL

## 2013-11-09 MED ORDER — OXYCODONE-ACETAMINOPHEN 10-325 MG PO TABS: 1.0000 | ORAL_TABLET | ORAL | Status: DC | PRN

## 2013-11-09 MED ORDER — CEFAZOLIN SODIUM-DEXTROSE 2-3 GM-% IV SOLR
INTRAVENOUS | Status: AC
Start: 2013-11-09 — End: 2013-11-09
  Filled 2013-11-09: qty 50

## 2013-11-09 MED ORDER — PROPOFOL 10 MG/ML IV BOLUS
INTRAVENOUS | Status: AC
  Filled 2013-11-09: qty 20

## 2013-11-09 MED ORDER — MIDAZOLAM HCL 2 MG/2ML IJ SOLN
1.0000 mg | INTRAMUSCULAR | Status: DC | PRN
  Administered 2013-11-09: 2 mg via INTRAVENOUS

## 2013-11-09 MED ORDER — GLYCOPYRROLATE 0.2 MG/ML IJ SOLN
INTRAMUSCULAR | Status: AC
  Filled 2013-11-09: qty 1

## 2013-11-09 MED ORDER — PROPOFOL 10 MG/ML IV BOLUS
INTRAVENOUS | Status: DC | PRN
  Administered 2013-11-09: 150 mg via INTRAVENOUS
  Administered 2013-11-09: 100 mg via INTRAVENOUS

## 2013-11-09 MED ORDER — FENTANYL CITRATE 0.05 MG/ML IJ SOLN
INTRAMUSCULAR | Status: DC | PRN
Start: 2013-11-09 — End: 2013-11-09
  Administered 2013-11-09 (×5): 50 ug via INTRAVENOUS

## 2013-11-09 MED ORDER — ONDANSETRON HCL 4 MG/2ML IJ SOLN: 4.0000 mg | Freq: Once | INTRAMUSCULAR | Status: DC | PRN

## 2013-11-09 MED ORDER — FENTANYL CITRATE 0.05 MG/ML IJ SOLN
25.0000 ug | INTRAMUSCULAR | Status: DC | PRN
  Administered 2013-11-09 (×4): 50 ug via INTRAVENOUS
  Filled 2013-11-09: qty 2

## 2013-11-09 MED ORDER — LIDOCAINE HCL (CARDIAC) 10 MG/ML IV SOLN
INTRAVENOUS | Status: DC | PRN
  Administered 2013-11-09: 20 mg via INTRAVENOUS

## 2013-11-09 MED ORDER — HYDROMORPHONE HCL 1 MG/ML IJ SOLN
0.2500 mg | INTRAMUSCULAR | Status: DC | PRN
  Administered 2013-11-09 (×2): 0.5 mg via INTRAVENOUS
  Filled 2013-11-09: qty 1

## 2013-11-09 MED ORDER — ONDANSETRON HCL 4 MG/2ML IJ SOLN
4.0000 mg | Freq: Once | INTRAMUSCULAR | Status: AC
  Administered 2013-11-09: 4 mg via INTRAVENOUS

## 2013-11-09 MED ORDER — LACTATED RINGERS IV SOLN
INTRAVENOUS | Status: DC
  Administered 2013-11-09: 1000 mL via INTRAVENOUS

## 2013-11-09 MED ORDER — LACTATED RINGERS IV SOLN: INTRAVENOUS | Status: DC

## 2013-11-09 MED ORDER — FENTANYL CITRATE 0.05 MG/ML IJ SOLN
INTRAMUSCULAR | Status: AC
  Filled 2013-11-09: qty 2

## 2013-11-09 MED ORDER — GLYCOPYRROLATE 0.2 MG/ML IJ SOLN
0.2000 mg | Freq: Once | INTRAMUSCULAR | Status: AC
  Administered 2013-11-09: 0.2 mg via INTRAVENOUS

## 2013-11-09 MED ORDER — FENTANYL CITRATE 0.05 MG/ML IJ SOLN
25.0000 ug | INTRAMUSCULAR | Status: AC
  Administered 2013-11-09 (×2): 25 ug via INTRAVENOUS

## 2013-11-09 MED ORDER — ALBUTEROL SULFATE (2.5 MG/3ML) 0.083% IN NEBU
2.5000 mg | INHALATION_SOLUTION | Freq: Once | RESPIRATORY_TRACT | Status: AC
  Administered 2013-11-09: 2.5 mg via RESPIRATORY_TRACT

## 2013-11-09 MED ORDER — ONDANSETRON HCL 4 MG/2ML IJ SOLN
INTRAMUSCULAR | Status: AC
  Filled 2013-11-09: qty 2

## 2013-11-09 MED ORDER — CEFAZOLIN SODIUM-DEXTROSE 2-3 GM-% IV SOLR
2.0000 g | INTRAVENOUS | Status: AC
  Administered 2013-11-09: 2 g via INTRAVENOUS

## 2013-11-09 MED ORDER — MIDAZOLAM HCL 2 MG/2ML IJ SOLN: 1.0000 mg | INTRAMUSCULAR | Status: DC | PRN

## 2013-11-09 SURGICAL SUPPLY — 31 items
BAG HAMPER (MISCELLANEOUS) ×3 IMPLANT
BANDAGE ELASTIC 4 VELCRO NS (GAUZE/BANDAGES/DRESSINGS) ×3 IMPLANT
BNDG CONFORM 3 STRL LF (GAUZE/BANDAGES/DRESSINGS) ×3 IMPLANT
CLOTH BEACON ORANGE TIMEOUT ST (SAFETY) ×3 IMPLANT
COVER LIGHT HANDLE STERIS (MISCELLANEOUS) ×6 IMPLANT
ELECT REM PT RETURN 9FT ADLT (ELECTROSURGICAL) ×3
ELECTRODE REM PT RTRN 9FT ADLT (ELECTROSURGICAL) ×1 IMPLANT
GAUZE IODOFORM PACK 1/2 7832 (GAUZE/BANDAGES/DRESSINGS) ×3 IMPLANT
GAUZE SPONGE 4X4 12PLY STRL (GAUZE/BANDAGES/DRESSINGS) ×3 IMPLANT
GAUZE XEROFORM 5X9 LF (GAUZE/BANDAGES/DRESSINGS) ×3 IMPLANT
GLOVE BIOGEL PI IND STRL 7.0 (GLOVE) ×2 IMPLANT
GLOVE BIOGEL PI IND STRL 7.5 (GLOVE) ×2 IMPLANT
GLOVE BIOGEL PI INDICATOR 7.0 (GLOVE) ×4
GLOVE BIOGEL PI INDICATOR 7.5 (GLOVE) ×4
GLOVE ECLIPSE 6.5 STRL STRAW (GLOVE) ×3 IMPLANT
GLOVE OPTIFIT SS 8.0 STRL (GLOVE) ×3 IMPLANT
GLOVE SKINSENSE NS SZ8.0 LF (GLOVE) ×2
GLOVE SKINSENSE STRL SZ8.0 LF (GLOVE) ×1 IMPLANT
GOWN STRL REUS W/TWL LRG LVL3 (GOWN DISPOSABLE) ×3 IMPLANT
GOWN STRL REUS W/TWL XL LVL3 (GOWN DISPOSABLE) ×3 IMPLANT
KIT ROOM TURNOVER APOR (KITS) ×3 IMPLANT
MANIFOLD NEPTUNE II (INSTRUMENTS) ×3 IMPLANT
MARKER SKIN DUAL TIP RULER LAB (MISCELLANEOUS) ×3 IMPLANT
NS IRRIG 1000ML POUR BTL (IV SOLUTION) ×3 IMPLANT
PACK BASIC LIMB (CUSTOM PROCEDURE TRAY) ×3 IMPLANT
PAD ARMBOARD 7.5X6 YLW CONV (MISCELLANEOUS) ×3 IMPLANT
SET BASIN LINEN APH (SET/KITS/TRAYS/PACK) ×3 IMPLANT
SUT ETHILON 3 0 FSL (SUTURE) ×3 IMPLANT
SWAB CULTURE LIQ STUART DBL (MISCELLANEOUS) ×3 IMPLANT
SYR BULB IRRIGATION 50ML (SYRINGE) ×3 IMPLANT
TUBE ANAEROBIC PORT A CUL  W/M (MISCELLANEOUS) ×3 IMPLANT

## 2013-11-09 NOTE — Op Note (Signed)
11/09/2013  8:17 AM  PATIENT:  [name]  43 y.o. female  PRE-OPERATIVE DIAGNOSIS:  WOUND INFECTION LEFT ANKLE  POST-OPERATIVE DIAGNOSIS:  * No post-op diagnosis entered *  PROCEDURE:  Procedure(s): INCISION AND DRAINAGE LEFT ANKLE WOUND (Left)  SURGEON:  Surgeon(s) and Role:    * Vickki HearingStanley E Jill Stopka, MD - Primary  PHYSICIAN ASSISTANT:   ASSISTANTS: none   ANESTHESIA:   general  EBL:  Total I/O In: 900 [I.V.:900] Out: -   BLOOD ADMINISTERED:none  DRAINS: 1/2 in iodoform packing    LOCAL MEDICATIONS USED:  NONE  SPECIMEN:  Source of Specimen:   Anaerobic and aerobic cultures from the lateral left ankle  DISPOSITION OF SPECIMEN:  Microbiology  COUNTS:  YES  TOURNIQUET:   Total Tourniquet Time Documented: Thigh (Left) - 15 minutes Total: Thigh (Left) - 15 minutes   DICTATION: .Reubin Milanragon Dictation  PLAN OF CARE: Discharge to home after PACU  PATIENT DISPOSITION:  PACU - hemodynamically stable.   Delay start of Pharmacological VTE agent (>24hrs) due to surgical blood loss or risk of bleeding: not applicable  The patient was encountered in the preoperative area and was identified by to prove mechanisms. The site was confirmed after chart review. Site was marked with an indelible marker.  The patient was taken to the operating room for general anesthesia. She was in the supine position. 2 g of Ancef were started. After sterile prep and drape the limb was exsanguinated by gravity and the tourniquet was elevated to 300 mm of mercury. The proximal two thirds of the incision where the drainage and skin breakdown areas were, was opened with a sharp knife. The skin edges were debrided sharply. There was no purulent material. Serous drainage is noted. There is no exposed metal.  As well as copiously irrigated. The distal portion of the wound was closed with 3-0 nylon sutures 1 3-0 nylon suture was placed proximally and then half-inch iodoform gauze was used to pack the  wound which was covered with sterile dressings. The Aircast brace was then reapplied  Patient was taken to the recovery room after extubation in stable condition  Plan Continue nonweightbearing Dressing change on Monday. Wet-to-dry dressings.

## 2013-11-09 NOTE — Transfer of Care (Signed)
Immediate Anesthesia Transfer of Care Note  Patient: Joyce PihMaria L Cox  Procedure(s) Performed: Procedure(s): INCISION AND DRAINAGE LEFT ANKLE WOUND (Left)  Patient Location: PACU  Anesthesia Type:General  Level of Consciousness: awake and patient cooperative  Airway & Oxygen Therapy: Patient Spontanous Breathing and aerosol face mask  Post-op Assessment: Report given to PACU RN, Post -op Vital signs reviewed and stable and Patient moving all extremities X 4  Post vital signs: Reviewed and stable  Complications: No apparent anesthesia complications

## 2013-11-09 NOTE — Telephone Encounter (Signed)
Regarding out-patient procedure, [facility], 11/09/13, CPT 10140, no pre-authorization required per Medicaid guidelines, per [employer] online inquiry.

## 2013-11-09 NOTE — Anesthesia Procedure Notes (Signed)
Procedure Name: LMA Insertion Date/Time: 11/09/2013 7:40 AM Performed by: Franco NonesYATES, Lavaughn Haberle S Pre-anesthesia Checklist: Patient identified, Patient being monitored, Emergency Drugs available, Timeout performed and Suction available Patient Re-evaluated:Patient Re-evaluated prior to inductionOxygen Delivery Method: Circle System Utilized Preoxygenation: Pre-oxygenation with 100% oxygen Intubation Type: IV induction Ventilation: Mask ventilation without difficulty LMA: LMA inserted LMA Size: 3.0 Number of attempts: 1 Placement Confirmation: positive ETCO2 and breath sounds checked- equal and bilateral Tube secured with: Tape

## 2013-11-09 NOTE — Interval H&P Note (Signed)
History and Physical Interval Note:  11/09/2013 7:22 AM  [name]  has presented today for surgery, with the diagnosis of WOUND INFECTION LEFT ANKLE  The various methods of treatment have been discussed with the patient and family. After consideration of risks, benefits and other options for treatment, the patient has consented to  Procedure(s): INCISION AND DRAINAGE LEFT ANKLE WOUND (Left) as a surgical intervention .  The patient's history has been reviewed, patient examined, no change in status, stable for surgery.  I have reviewed the patient's chart and labs.  Questions were answered to the patient's satisfaction.     Fuller CanadaStanley Harrison

## 2013-11-09 NOTE — Brief Op Note (Signed)
11/09/2013  8:17 AM  PATIENT:  [name]  42 y.o. female  PRE-OPERATIVE DIAGNOSIS:  WOUND INFECTION LEFT ANKLE  POST-OPERATIVE DIAGNOSIS:  * No post-op diagnosis entered *  PROCEDURE:  Procedure(s): INCISION AND DRAINAGE LEFT ANKLE WOUND (Left)  SURGEON:  Surgeon(s) and Role:    * Diangelo Radel E Garl Speigner, MD - Primary  PHYSICIAN ASSISTANT:   ASSISTANTS: none   ANESTHESIA:   general  EBL:  Total I/O In: 900 [I.V.:900] Out: -   BLOOD ADMINISTERED:none  DRAINS: 1/2 in iodoform packing    LOCAL MEDICATIONS USED:  NONE  SPECIMEN:  Source of Specimen:   Anaerobic and aerobic cultures from the lateral left ankle  DISPOSITION OF SPECIMEN:  Microbiology  COUNTS:  YES  TOURNIQUET:   Total Tourniquet Time Documented: Thigh (Left) - 15 minutes Total: Thigh (Left) - 15 minutes   DICTATION: .Dragon Dictation  PLAN OF CARE: Discharge to home after PACU  PATIENT DISPOSITION:  PACU - hemodynamically stable.   Delay start of Pharmacological VTE agent (>24hrs) due to surgical blood loss or risk of bleeding: not applicable  The patient was encountered in the preoperative area and was identified by to prove mechanisms. The site was confirmed after chart review. Site was marked with an indelible marker.  The patient was taken to the operating room for general anesthesia. She was in the supine position. 2 g of Ancef were started. After sterile prep and drape the limb was exsanguinated by gravity and the tourniquet was elevated to 300 mm of mercury. The proximal two thirds of the incision where the drainage and skin breakdown areas were, was opened with a sharp knife. The skin edges were debrided sharply. There was no purulent material. Serous drainage is noted. There is no exposed metal.  As well as copiously irrigated. The distal portion of the wound was closed with 3-0 nylon sutures 1 3-0 nylon suture was placed proximally and then half-inch iodoform gauze was used to pack the  wound which was covered with sterile dressings. The Aircast brace was then reapplied  Patient was taken to the recovery room after extubation in stable condition  Plan Continue nonweightbearing Dressing change on Monday. Wet-to-dry dressings. 

## 2013-11-09 NOTE — Discharge Instructions (Signed)
Keep foot elevated when not walking Apply ice packs for 20 minutes every 2 hours while awake No weightbearing on left foot

## 2013-11-09 NOTE — Anesthesia Postprocedure Evaluation (Signed)
  Anesthesia Post-op Note  Patient: [name]  Procedure(s) Performed: Procedure(s): INCISION AND DRAINAGE LEFT ANKLE WOUND (Left)  Patient Location: PACU  Anesthesia Type:General  Level of Consciousness: sedated and patient cooperative  Airway and Oxygen Therapy: Patient Spontanous Breathing and non-rebreather face mask  Post-op Pain: 10 /10  Post-op Assessment: Post-op Vital signs reviewed, Patient's Cardiovascular Status Stable and Respiratory Function Stable  Post-op Vital Signs: Reviewed and stable  Last Vitals:  Filed Vitals:   11/09/13 0730  BP: 115/72  Temp:   Resp: 20    Complications: No apparent anesthesia complications

## 2013-11-09 NOTE — Anesthesia Preprocedure Evaluation (Signed)
Anesthesia Evaluation  Patient identified by MRN, date of birth, ID band Patient awake    Reviewed: Allergy & Precautions, H&P , NPO status , Patient's Chart, lab work & pertinent test results  Airway Mallampati: III TM Distance: >3 FB     Dental  (+) Teeth Intact   Pulmonary asthma , Current Smoker,  breath sounds clear to auscultation        Cardiovascular hypertension, Pt. on medications Rhythm:Regular Rate:Normal     Neuro/Psych    GI/Hepatic negative GI ROS,   Endo/Other    Renal/GU      Musculoskeletal   Abdominal   Peds  Hematology   Anesthesia Other Findings   Reproductive/Obstetrics                           Anesthesia Physical Anesthesia Plan  ASA: II  Anesthesia Plan: General   Post-op Pain Management:    Induction: Intravenous  Airway Management Planned: LMA  Additional Equipment:   Intra-op Plan:   Post-operative Plan: Extubation in OR  Informed Consent: I have reviewed the patients History and Physical, chart, labs and discussed the procedure including the risks, benefits and alternatives for the proposed anesthesia with the patient or authorized representative who has indicated his/her understanding and acceptance.     Plan Discussed with:   Anesthesia Plan Comments:         Anesthesia Quick Evaluation

## 2013-11-10 ENCOUNTER — Encounter (HOSPITAL_COMMUNITY): Payer: Self-pay | Admitting: Orthopedic Surgery

## 2013-11-11 ENCOUNTER — Telehealth: Payer: Self-pay | Admitting: *Deleted

## 2013-11-11 NOTE — Telephone Encounter (Signed)
Patient asking should she be on an antibiotic post surgery 11/09/13, also blood is seeping through her dressing.

## 2013-11-12 LAB — WOUND CULTURE: Gram Stain: NONE SEEN

## 2013-11-14 ENCOUNTER — Ambulatory Visit (INDEPENDENT_AMBULATORY_CARE_PROVIDER_SITE_OTHER): Payer: Self-pay | Admitting: Orthopedic Surgery

## 2013-11-14 VITALS — Ht 61.0 in | Wt 140.0 lb

## 2013-11-14 DIAGNOSIS — S82892D Other fracture of left lower leg, subsequent encounter for closed fracture with routine healing: Secondary | ICD-10-CM

## 2013-11-14 DIAGNOSIS — T8131XD Disruption of external operation (surgical) wound, not elsewhere classified, subsequent encounter: Secondary | ICD-10-CM

## 2013-11-14 LAB — ANAEROBIC CULTURE: GRAM STAIN: NONE SEEN

## 2013-11-14 NOTE — Progress Notes (Signed)
Annice PihMaria L Trexler is a 43 y.o. female patient.  Status post open treatment internal fixation of fracture dislocation of the left ankle on 09/28/2013  Status post incision and drainage on 11/09/2013  Culture came back Pseudomonas sensitive to cephalosporins. I think this is a contaminant there was no purulent material at the time of surgery  I think the wound got wet under the cast.   No diagnosis found. Past Medical History  Diagnosis Date  . Hypertension   . Drug abuse   . Asthma     as child  . Anemia    No past surgical history pertinent negatives on file.   Allergies  Allergen Reactions  . Wellbutrin [Bupropion] Hives   Active Problems:   * No active hospital problems. *  Height 5\' 1"  (1.549 m), weight 140 lb (63.504 kg).  Subjective Objective Assessment & Plan  The packing was removed. A new wet-to-dry dressing was placed. Will move and change it again on Thursday and then set her up on a Tuesday Thursday dressing change.  Screw should come out around November 11 (syndesmosis screw)  Fuller CanadaStanley Gunner Iodice 11/14/2013

## 2013-11-17 ENCOUNTER — Ambulatory Visit (INDEPENDENT_AMBULATORY_CARE_PROVIDER_SITE_OTHER): Payer: Self-pay | Admitting: Orthopedic Surgery

## 2013-11-17 VITALS — BP 125/85 | Ht 61.0 in | Wt 140.0 lb

## 2013-11-17 DIAGNOSIS — S82892D Other fracture of left lower leg, subsequent encounter for closed fracture with routine healing: Secondary | ICD-10-CM

## 2013-11-17 DIAGNOSIS — T8131XD Disruption of external operation (surgical) wound, not elsewhere classified, subsequent encounter: Secondary | ICD-10-CM

## 2013-11-17 NOTE — Progress Notes (Signed)
Chief Complaint  Patient presents with  . Dressing Change    dressing change Lt ankle, DOS 11/09/13   post op DOS 09/28/13  open treatment internal fixation left ankle with syndesmosis screw  On September 30 she had irrigation debridement incision and drainage of wound and she is now undergoing wet-to-dry dressing changes with packing. Her wound looks clean. She probably has some subtle signs of complex regional pain syndrome at this point.   New dressing and brace using a air cast was applied. Continue nonweightbearing followup on Tuesday for dressing change

## 2013-11-22 ENCOUNTER — Ambulatory Visit (INDEPENDENT_AMBULATORY_CARE_PROVIDER_SITE_OTHER): Payer: Self-pay | Admitting: Orthopedic Surgery

## 2013-11-22 ENCOUNTER — Encounter: Payer: Self-pay | Admitting: Orthopedic Surgery

## 2013-11-22 VITALS — BP 135/89 | Ht 61.0 in | Wt 140.0 lb

## 2013-11-22 DIAGNOSIS — T8131XD Disruption of external operation (surgical) wound, not elsewhere classified, subsequent encounter: Secondary | ICD-10-CM

## 2013-11-22 DIAGNOSIS — S82892D Other fracture of left lower leg, subsequent encounter for closed fracture with routine healing: Secondary | ICD-10-CM

## 2013-11-22 MED ORDER — OXYCODONE-ACETAMINOPHEN 10-325 MG PO TABS: 1.0000 | ORAL_TABLET | ORAL | Status: DC | PRN

## 2013-11-22 NOTE — Progress Notes (Signed)
Chief Complaint  Patient presents with  . Dressing Change    5 day recheck, dressing charge, Left ankle, DOS 11/09/13   Status post open treatment internal fixation fracture dislocation bimalleolar fracture left ankle on 09/27/2013  Dressing change today for the proximal portion of the incision we also remove the sutures  The patient has a tibialis anterior contracture as well. This will require further intervention. She also has Achilles tendon contracture which will require further intervention. We can do that when we do her screw removal in November  The wound looks good. There are no signs of infection  Continue current pain medication. Again the patient has some complex regional pain syndrome findings as well

## 2013-11-24 ENCOUNTER — Ambulatory Visit (INDEPENDENT_AMBULATORY_CARE_PROVIDER_SITE_OTHER): Payer: Self-pay | Admitting: Orthopedic Surgery

## 2013-11-24 ENCOUNTER — Encounter: Payer: Self-pay | Admitting: Orthopedic Surgery

## 2013-11-24 VITALS — Ht 61.0 in | Wt 140.0 lb

## 2013-11-24 DIAGNOSIS — S82892D Other fracture of left lower leg, subsequent encounter for closed fracture with routine healing: Secondary | ICD-10-CM

## 2013-11-24 MED ORDER — OXYCODONE HCL 15 MG PO TABS: 15.0000 mg | ORAL_TABLET | ORAL | Status: DC | PRN

## 2013-11-24 NOTE — Progress Notes (Signed)
Chief Complaint  Patient presents with  . Dressing Change    dressing change Left ankle, DOS 11/09/13    Dressing change left ankle  Reminder we will be taking out a screw in doing a ankle procedure try to get her foot up to neutral position later but her dressing looks good. I switched her over to Roxicet 15 mg. She is an element of RSD as well. Long haul ahead to get this to a plantigrade stable weightbearing foot

## 2013-11-29 ENCOUNTER — Ambulatory Visit (INDEPENDENT_AMBULATORY_CARE_PROVIDER_SITE_OTHER): Payer: Self-pay | Admitting: Orthopedic Surgery

## 2013-11-29 ENCOUNTER — Encounter: Payer: Self-pay | Admitting: Orthopedic Surgery

## 2013-11-29 VITALS — BP 114/81 | Ht 61.0 in | Wt 140.0 lb

## 2013-11-29 DIAGNOSIS — T8131XD Disruption of external operation (surgical) wound, not elsewhere classified, subsequent encounter: Secondary | ICD-10-CM

## 2013-11-29 DIAGNOSIS — S82892D Other fracture of left lower leg, subsequent encounter for closed fracture with routine healing: Secondary | ICD-10-CM

## 2013-11-29 NOTE — Progress Notes (Signed)
Patient ID: [name], female   DOB: 22-Oct-1970, 43 y.o.   MRN: [id number]  Chief Complaint  Patient presents with  . Dressing Change    dressing change, left ankle, DOS 11/09/13   BP 114/81  Ht 5\' 1"  (1.549 m)  Wt 140 lb (63.504 kg)  BMI 26.47 kg/m2  Initial surgery was on 09/28/2013 (internal fixation fracture dislocation left ankle with medial and lateral fixation and syndesmosis screw;  followed by incision and drainage left ankle on 11/09/2013  Wound continues to granulate   Change again thurs

## 2013-12-01 ENCOUNTER — Ambulatory Visit (INDEPENDENT_AMBULATORY_CARE_PROVIDER_SITE_OTHER): Payer: Self-pay | Admitting: Orthopedic Surgery

## 2013-12-01 VITALS — Ht 61.0 in | Wt 140.0 lb

## 2013-12-01 DIAGNOSIS — S82892D Other fracture of left lower leg, subsequent encounter for closed fracture with routine healing: Secondary | ICD-10-CM

## 2013-12-01 DIAGNOSIS — T8131XD Disruption of external operation (surgical) wound, not elsewhere classified, subsequent encounter: Secondary | ICD-10-CM

## 2013-12-01 MED ORDER — OXYCODONE HCL 15 MG PO TABS: 15.0000 mg | ORAL_TABLET | ORAL | Status: DC | PRN

## 2013-12-01 NOTE — Progress Notes (Signed)
Nurse dressing change wound clean

## 2013-12-06 ENCOUNTER — Ambulatory Visit (INDEPENDENT_AMBULATORY_CARE_PROVIDER_SITE_OTHER): Payer: Self-pay | Admitting: Orthopedic Surgery

## 2013-12-06 ENCOUNTER — Encounter: Payer: Self-pay | Admitting: Orthopedic Surgery

## 2013-12-06 VITALS — BP 127/89 | Ht 61.0 in | Wt 140.0 lb

## 2013-12-06 DIAGNOSIS — T8131XD Disruption of external operation (surgical) wound, not elsewhere classified, subsequent encounter: Secondary | ICD-10-CM

## 2013-12-06 DIAGNOSIS — S82892D Other fracture of left lower leg, subsequent encounter for closed fracture with routine healing: Secondary | ICD-10-CM

## 2013-12-06 NOTE — Progress Notes (Signed)
Patient ID: [name], female   DOB: 09/21/1970, 43 y.o.   MRN: [id number] Chief Complaint  Patient presents with  . Dressing Change    Left ankle dressing change, DOS 11/09/13, initial surgery 09/28/2013 open treatment internal fixation of the ankle fracture dislocation   The patient is doing much better on oxycodone 15 mg. Her wound is 5 cm long by 1 cm wide with good granulation tissue. She still has elements of complex regional pain syndrome which will be addressed once her wound healing has occurred. She still on the schedule for screw removal and Achilles tendon lengthening

## 2013-12-08 ENCOUNTER — Encounter: Payer: Self-pay | Admitting: Orthopedic Surgery

## 2013-12-08 ENCOUNTER — Ambulatory Visit (INDEPENDENT_AMBULATORY_CARE_PROVIDER_SITE_OTHER): Payer: Self-pay | Admitting: Orthopedic Surgery

## 2013-12-08 ENCOUNTER — Telehealth: Payer: Self-pay | Admitting: Orthopedic Surgery

## 2013-12-08 VITALS — BP 129/84 | Ht 61.0 in | Wt 140.0 lb

## 2013-12-08 DIAGNOSIS — T8131XD Disruption of external operation (surgical) wound, not elsewhere classified, subsequent encounter: Secondary | ICD-10-CM

## 2013-12-08 DIAGNOSIS — S82892D Other fracture of left lower leg, subsequent encounter for closed fracture with routine healing: Secondary | ICD-10-CM

## 2013-12-08 NOTE — Telephone Encounter (Signed)
Patient called and stated that she feel with her walker

## 2013-12-08 NOTE — Progress Notes (Signed)
Patient ID: [name], female   DOB: March 21, 1970, 43 y.o.   MRN: [id number] Chief Complaint  Patient presents with  . Dressing Change    dressing change Left ankle, DOS 11/09/13

## 2013-12-12 ENCOUNTER — Encounter: Payer: Self-pay | Admitting: Orthopedic Surgery

## 2013-12-13 ENCOUNTER — Ambulatory Visit (INDEPENDENT_AMBULATORY_CARE_PROVIDER_SITE_OTHER): Payer: Self-pay | Admitting: Orthopedic Surgery

## 2013-12-13 VITALS — Ht 61.0 in | Wt 140.0 lb

## 2013-12-13 DIAGNOSIS — S82892D Other fracture of left lower leg, subsequent encounter for closed fracture with routine healing: Secondary | ICD-10-CM

## 2013-12-13 MED ORDER — OXYCODONE HCL 15 MG PO TABS: 15.0000 mg | ORAL_TABLET | ORAL | Status: DC | PRN

## 2013-12-13 NOTE — Progress Notes (Signed)
Patient ID: [name], female   DOB: [birth year], 43 y.o.   MRN: [id number] Chief Complaint  Patient presents with  . Dressing Change    dressing change left ankle, DOS 11/09/13    Continued improvement in the wound of the left ankle with still plantar flexion contracture. This will be addressed at surgery. When she comes back next time we can plan on a date to have the syndesmosis screw removed Achilles lengthening capsular release and casting to get the foot plantigrade

## 2013-12-15 ENCOUNTER — Ambulatory Visit (INDEPENDENT_AMBULATORY_CARE_PROVIDER_SITE_OTHER): Payer: Self-pay | Admitting: Orthopedic Surgery

## 2013-12-15 VITALS — BP 154/105 | Ht 61.0 in | Wt 140.0 lb

## 2013-12-15 DIAGNOSIS — S82892D Other fracture of left lower leg, subsequent encounter for closed fracture with routine healing: Secondary | ICD-10-CM

## 2013-12-15 DIAGNOSIS — T8131XA Disruption of external operation (surgical) wound, not elsewhere classified, initial encounter: Secondary | ICD-10-CM

## 2013-12-15 DIAGNOSIS — T8131XD Disruption of external operation (surgical) wound, not elsewhere classified, subsequent encounter: Secondary | ICD-10-CM

## 2013-12-15 NOTE — Progress Notes (Signed)
Patient ID: [name], female   DOB: 01/18/1971, 43 y.o.   MRN: [id number] Chief Complaint  Patient presents with  . Dressing Change    dressing change left ankle, DOS 11/09/13    The dressing continues to be changed on a twice-weekly basis. She will have removal of syndesmosis screw and Achilles tendon lengthening for contracture of the left ankle after fracture dislocation of the ankle.  Her wound is now less than 5 cm long with 1-2 mm width. Continue dressing changes follow-up for dressing change surgery scheduled for November 18

## 2013-12-16 ENCOUNTER — Other Ambulatory Visit: Payer: Self-pay | Admitting: *Deleted

## 2013-12-19 ENCOUNTER — Telehealth: Payer: Self-pay | Admitting: Orthopedic Surgery

## 2013-12-19 NOTE — Telephone Encounter (Signed)
Regarding out-patient surgery scheduled at [facility] 12/28/13 for release procedure to ankle, (planned CPT [id number]) - no pre-authorization is required per [employer] online system, Medicaid's third party authorization system.

## 2013-12-20 ENCOUNTER — Ambulatory Visit (INDEPENDENT_AMBULATORY_CARE_PROVIDER_SITE_OTHER): Payer: Self-pay | Admitting: Orthopedic Surgery

## 2013-12-20 VITALS — BP 141/92 | Ht 61.0 in | Wt 140.0 lb

## 2013-12-20 DIAGNOSIS — T8131XD Disruption of external operation (surgical) wound, not elsewhere classified, subsequent encounter: Secondary | ICD-10-CM

## 2013-12-20 NOTE — Progress Notes (Signed)
Patient ID: [name], female   DOB: [birth year], 43 y.o.   MRN: [id number] Dressing change we now have a 3 x 4 mm wound area with a plantarflexed foot which we will address with surgery she'll come back on Thursday for another dressing change.

## 2013-12-21 NOTE — Patient Instructions (Signed)
Annice PihMaria L Trexler  12/21/2013   Your procedure is scheduled on:   12/28/2013  Report to [facility] at  845  AM.  Call this number if you have problems the morning of surgery: [phone]   Remember:   Do not eat food or drink liquids after midnight.   Take these medicines the morning of surgery with A SIP OF WATER: amlodipine, chlorathaladone, oxycodone   Do not wear jewelry, make-up or nail polish.  Do not wear lotions, powders, or perfumes.   Do not shave 48 hours prior to surgery. Men may shave face and neck.  Do not bring valuables to the hospital.  [facility] is not responsible for any belongings or valuables.               Contacts, dentures or bridgework may not be worn into surgery.  Leave suitcase in the car. After surgery it may be brought to your room.  For patients admitted to the hospital, discharge time is determined by your treatment team.               Patients discharged the day of surgery will not be allowed to drive home.  Name and phone number of your driver: family  Special Instructions: Shower using CHG 2 nights before surgery and the night before surgery.  If you shower the day of surgery use CHG.  Use special wash - you have one bottle of CHG for all showers.  You should use approximately 1/3 of the bottle for each shower.   Please read over the following fact sheets that you were given: Pain Booklet, Coughing and Deep Breathing, Surgical Site Infection Prevention, Anesthesia Post-op Instructions and Care and Recovery After Surgery Hardware Removal Hardware removal is a procedure that removes medical devices used to repair broken bones. Hardware may be pins, screws, rods, wires, plates, or other implants. Some types of hardware are meant to stay in place permanently. Other types of hardware may be removed after the broken bone has healed. Sometimes, hardware is removed because it causes problems. These problems include infection, ongoing pain, or failure  of the device. In certain cases, older types of hardware may be replaced with newer, better materials. Young children often need to have hardware removed in order to allow for proper bone growth. LET YOUR CAREGIVER KNOW ABOUT:   Allergies to food or medicine.  Medicines taken, including vitamins, herbs, eyedrops, over-the-counter medicines, and creams.  Use of steroids (by mouth or creams).  Previous problems with anesthetics or numbing medicines.  History of bleeding problems or blood clots.  Previous surgery.  Other health problems, including diabetes and kidney problems.  Possibility of pregnancy, if this applies. RISKS AND COMPLICATIONS   Infection.  Bleeding.  Pain.  Bone breaking again (refracture).  Failure to completely remove all implants. BEFORE THE PROCEDURE   You may be asked to stop taking blood thinners, aspirin, and/or nonsteroidal anti-inflammatory drugs (like ibuprofen) before the procedure.  You will usually be asked to stop eating and drinking at least 6 hours before the procedure.  If your procedure is done so that you go home the same day (outpatient), you will need someone to drive you home. PROCEDURE   You will be asked to change into a hospital gown.  You will lie on an exam table. A variety of monitors will be connected to you in order to track your heart rate, blood pressure, and breathing throughout the procedure.  You  will have an intravenous (IV) access placed in your vein.  You may be given general anesthesia to help you sleep through the procedure or a sedative to relax you. You will also be given a local or regional anesthetic to numb the area.  X-rays may be taken to accurately find the hardware.  The surgeon will make a cut (incision) over the area where the hardware is located.  The hardware will be carefully removed.  The incision will be closed with stitches (sutures), staples, or special glue. A bandage will be placed over the  area to keep it clean and dry.  Often splints, casts, or removable walking boots are used to protect your limb while your wound heals. AFTER THE PROCEDURE   You may be sleepy.  You may have some pain or feel sick to your stomach (nauseous). This can usually be controlled with medicines.  You will stay in the recovery room until you are awake and able to drink fluids.  Check with your caregiver about when you can return to your usual level of activity or whether you will need physical therapy or rehabilitation. HOME CARE INSTRUCTIONS   Take all medicines exactly as directed.  Follow any prescribed diet.  Follow instructions regarding both rest and physical activity. Be sure you understand when it is okay to bear weight. SEEK IMMEDIATE MEDICAL CARE IF:   You have severe or lasting pain.  You or your child has an oral temperature above 102 F (38.9 C), not controlled by medicine.  Chills develop.  The incision area appears red, hot, puffy (swollen), or covered with pus.  You have difficulty breathing.  You cannot pass gas.  You are unable to have a bowel movement within 2 days.  You have persistent numbness in the limb beyond 24 hours. MAKE SURE YOU:   Understand these instructions.  Will watch your condition.  Will get help right away if you are not doing well or get worse. Document Released: 11/24/2008 Document Revised: 04/21/2011 Document Reviewed: 11/24/2008 [facility] Patient Information 2015 [employer] [place]. This information is not intended to replace advice given to you by your health care provider. Make sure you discuss any questions you have with your health care provider. PATIENT INSTRUCTIONS POST-ANESTHESIA  IMMEDIATELY FOLLOWING SURGERY:  Do not drive or operate machinery for the first twenty four hours after surgery.  Do not make any important decisions for twenty four hours after surgery or while taking narcotic pain medications or sedatives.  If you develop  intractable nausea and vomiting or a severe headache please notify your doctor immediately.  FOLLOW-UP:  Please make an appointment with your surgeon as instructed. You do not need to follow up with anesthesia unless specifically instructed to do so.  WOUND CARE INSTRUCTIONS (if applicable):  Keep a dry clean dressing on the anesthesia/puncture wound site if there is drainage.  Once the wound has quit draining you may leave it open to air.  Generally you should leave the bandage intact for twenty four hours unless there is drainage.  If the epidural site drains for more than 36-48 hours please call the anesthesia department.  QUESTIONS?:  Please feel free to call your physician or the hospital operator if you have any questions, and they will be happy to assist you.

## 2013-12-22 ENCOUNTER — Encounter: Payer: Self-pay | Admitting: Orthopedic Surgery

## 2013-12-22 ENCOUNTER — Encounter (HOSPITAL_COMMUNITY)
Admission: RE | Admit: 2013-12-22 | Discharge: 2013-12-22 | Disposition: A | Discharge status: Home / Self Care | Payer: Medicaid Other | Source: Ambulatory Visit | Attending: Orthopedic Surgery | Admitting: Orthopedic Surgery

## 2013-12-22 ENCOUNTER — Ambulatory Visit (INDEPENDENT_AMBULATORY_CARE_PROVIDER_SITE_OTHER): Payer: Self-pay | Admitting: Orthopedic Surgery

## 2013-12-22 ENCOUNTER — Encounter (HOSPITAL_COMMUNITY): Payer: Self-pay

## 2013-12-22 DIAGNOSIS — Z01812 Encounter for preprocedural laboratory examination: Secondary | ICD-10-CM | POA: Insufficient documentation

## 2013-12-22 DIAGNOSIS — S82892A Other fracture of left lower leg, initial encounter for closed fracture: Secondary | ICD-10-CM

## 2013-12-22 DIAGNOSIS — T8131XD Disruption of external operation (surgical) wound, not elsewhere classified, subsequent encounter: Secondary | ICD-10-CM

## 2013-12-22 LAB — BASIC METABOLIC PANEL
Anion gap: 17 — ABNORMAL HIGH (ref 5–15)
BUN: 7 mg/dL (ref 6–23)
CO2: 22 mEq/L (ref 19–32)
Calcium: 9.5 mg/dL (ref 8.4–10.5)
Chloride: 103 mEq/L (ref 96–112)
Creatinine, Ser: 0.82 mg/dL (ref 0.50–1.10)
GFR calc Af Amer: >90 mL/min (ref >90)
GFR, EST NON AFRICAN AMERICAN: 88 mL/min — AB (ref >90)
GLUCOSE: 119 mg/dL — AB (ref 70–99)
POTASSIUM: 3.4 meq/L — AB (ref 3.7–5.3)
SODIUM: 142 meq/L (ref 137–147)

## 2013-12-22 LAB — HCG, SERUM, QUALITATIVE: Preg, Serum: NEGATIVE

## 2013-12-22 LAB — HEMOGLOBIN AND HEMATOCRIT, BLOOD
HCT: 43.2 % (ref 36.0–46.0)
HEMOGLOBIN: 15.1 g/dL — AB (ref 12.0–15.0)

## 2013-12-22 MED ORDER — OXYCODONE HCL 15 MG PO TABS: 15.0000 mg | ORAL_TABLET | ORAL | Status: DC | PRN

## 2013-12-22 NOTE — Progress Notes (Signed)
Patient ID: Joyce PihMaria L Cox, female   DOB: 03/28/1970, 43 y.o.   MRN: [id number]  Wet-to-dry dressing change left ankle. 3 x .3 mm wound over the left ankle. Slight abrasion distal to that. Plantar flexion contracture right foot inversion supination contracture as well. Patient will require tendon release in the coming days.  Follow-up Monday repeat dressing change

## 2013-12-22 NOTE — Pre-Procedure Instructions (Signed)
Patient given information to sign up for my chart at home. 

## 2013-12-26 ENCOUNTER — Ambulatory Visit (INDEPENDENT_AMBULATORY_CARE_PROVIDER_SITE_OTHER): Payer: Self-pay | Admitting: Orthopedic Surgery

## 2013-12-26 VITALS — Ht 61.0 in | Wt 132.0 lb

## 2013-12-26 DIAGNOSIS — T8131XD Disruption of external operation (surgical) wound, not elsewhere classified, subsequent encounter: Secondary | ICD-10-CM

## 2013-12-26 NOTE — Progress Notes (Signed)
Patient ID: [name], female   DOB: 1970-06-06, 43 y.o.   MRN: [id number] Dressing change , surgery on Weds
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
## 2013-12-28 ENCOUNTER — Ambulatory Visit (HOSPITAL_COMMUNITY): Payer: Medicaid Other | Admitting: Anesthesiology

## 2013-12-28 ENCOUNTER — Encounter (HOSPITAL_COMMUNITY): Payer: Self-pay | Admitting: *Deleted

## 2013-12-28 ENCOUNTER — Ambulatory Visit (HOSPITAL_COMMUNITY): Payer: Medicaid Other

## 2013-12-28 ENCOUNTER — Encounter (HOSPITAL_COMMUNITY)
Admission: RE | Disposition: A | Discharge status: Home / Self Care | Payer: Self-pay | Source: Ambulatory Visit | Attending: Orthopedic Surgery

## 2013-12-28 ENCOUNTER — Ambulatory Visit (HOSPITAL_COMMUNITY)
Admission: RE | Admit: 2013-12-28 | Discharge: 2013-12-28 | Disposition: A | Discharge status: Home / Self Care | Payer: Medicaid Other | Source: Ambulatory Visit | Attending: Orthopedic Surgery | Admitting: Orthopedic Surgery

## 2013-12-28 DIAGNOSIS — M6702 Short Achilles tendon (acquired), left ankle: Secondary | ICD-10-CM | POA: Insufficient documentation

## 2013-12-28 DIAGNOSIS — S82852S Displaced trimalleolar fracture of left lower leg, sequela: Secondary | ICD-10-CM

## 2013-12-28 DIAGNOSIS — I10 Essential (primary) hypertension: Secondary | ICD-10-CM | POA: Diagnosis not present

## 2013-12-28 DIAGNOSIS — S82852D Displaced trimalleolar fracture of left lower leg, subsequent encounter for closed fracture with routine healing: Secondary | ICD-10-CM | POA: Insufficient documentation

## 2013-12-28 DIAGNOSIS — F17210 Nicotine dependence, cigarettes, uncomplicated: Secondary | ICD-10-CM | POA: Diagnosis not present

## 2013-12-28 DIAGNOSIS — Z472 Encounter for removal of internal fixation device: Secondary | ICD-10-CM

## 2013-12-28 DIAGNOSIS — Z888 Allergy status to other drugs, medicaments and biological substances status: Secondary | ICD-10-CM | POA: Diagnosis not present

## 2013-12-28 HISTORY — PX: ACHILLES TENDON LENGTHENING: SHX6455

## 2013-12-28 HISTORY — PX: CAST APPLICATION: SHX380

## 2013-12-28 HISTORY — PX: HARDWARE REMOVAL: SHX979

## 2013-12-28 SURGERY — REMOVAL, HARDWARE
Anesthesia: General | Site: Leg Lower | Laterality: Left

## 2013-12-28 MED ORDER — NEOSTIGMINE METHYLSULFATE 10 MG/10ML IV SOLN
INTRAVENOUS | Status: AC
Start: 2013-12-28 — End: 2013-12-28
  Filled 2013-12-28: qty 1

## 2013-12-28 MED ORDER — OXYCODONE HCL 15 MG PO TABS: 15.0000 mg | ORAL_TABLET | ORAL | Status: DC | PRN

## 2013-12-28 MED ORDER — ALBUTEROL SULFATE (2.5 MG/3ML) 0.083% IN NEBU
2.5000 mg | INHALATION_SOLUTION | Freq: Once | RESPIRATORY_TRACT | Status: AC
  Administered 2013-12-28: 2.5 mg via RESPIRATORY_TRACT

## 2013-12-28 MED ORDER — ALBUTEROL SULFATE (2.5 MG/3ML) 0.083% IN NEBU
INHALATION_SOLUTION | RESPIRATORY_TRACT | Status: AC
  Filled 2013-12-28: qty 3

## 2013-12-28 MED ORDER — PROPOFOL 10 MG/ML IV BOLUS
INTRAVENOUS | Status: AC
  Filled 2013-12-28: qty 20

## 2013-12-28 MED ORDER — PROPOFOL 10 MG/ML IV BOLUS
INTRAVENOUS | Status: DC | PRN
  Administered 2013-12-28: 140 mg via INTRAVENOUS

## 2013-12-28 MED ORDER — LACTATED RINGERS IV SOLN
INTRAVENOUS | Status: DC
  Administered 2013-12-28: 10:00:00 via INTRAVENOUS

## 2013-12-28 MED ORDER — SODIUM CHLORIDE 0.9 % IR SOLN
Status: DC | PRN
  Administered 2013-12-28 (×2): 1000 mL

## 2013-12-28 MED ORDER — LIDOCAINE HCL (PF) 1 % IJ SOLN
INTRAMUSCULAR | Status: AC
  Filled 2013-12-28: qty 5

## 2013-12-28 MED ORDER — GLYCOPYRROLATE 0.2 MG/ML IJ SOLN
INTRAMUSCULAR | Status: AC
  Filled 2013-12-28: qty 2

## 2013-12-28 MED ORDER — CEFAZOLIN SODIUM-DEXTROSE 2-3 GM-% IV SOLR
2.0000 g | INTRAVENOUS | Status: AC
  Administered 2013-12-28: 2 g via INTRAVENOUS

## 2013-12-28 MED ORDER — MIDAZOLAM HCL 5 MG/5ML IJ SOLN
INTRAMUSCULAR | Status: DC | PRN
  Administered 2013-12-28: 2 mg via INTRAVENOUS

## 2013-12-28 MED ORDER — FENTANYL CITRATE 0.05 MG/ML IJ SOLN
25.0000 ug | INTRAMUSCULAR | Status: DC | PRN
  Administered 2013-12-28 (×4): 50 ug via INTRAVENOUS
  Filled 2013-12-28: qty 2

## 2013-12-28 MED ORDER — BUPIVACAINE-EPINEPHRINE 0.5% -1:200000 IJ SOLN
INTRAMUSCULAR | Status: DC | PRN
Start: 2013-12-28 — End: 2013-12-28
  Administered 2013-12-28: 30 mL

## 2013-12-28 MED ORDER — OXYCODONE HCL 5 MG PO TABS
ORAL_TABLET | ORAL | Status: AC
  Filled 2013-12-28: qty 2

## 2013-12-28 MED ORDER — CEFAZOLIN SODIUM-DEXTROSE 2-3 GM-% IV SOLR
INTRAVENOUS | Status: AC
  Filled 2013-12-28: qty 50

## 2013-12-28 MED ORDER — ROCURONIUM BROMIDE 100 MG/10ML IV SOLN
INTRAVENOUS | Status: DC | PRN
  Administered 2013-12-28: 35 mg via INTRAVENOUS

## 2013-12-28 MED ORDER — FENTANYL CITRATE 0.05 MG/ML IJ SOLN
INTRAMUSCULAR | Status: AC
  Filled 2013-12-28: qty 5

## 2013-12-28 MED ORDER — GLYCOPYRROLATE 0.2 MG/ML IJ SOLN
INTRAMUSCULAR | Status: DC | PRN
  Administered 2013-12-28: 0.4 mg via INTRAVENOUS

## 2013-12-28 MED ORDER — LABETALOL HCL 5 MG/ML IV SOLN
INTRAVENOUS | Status: DC | PRN
  Administered 2013-12-28 (×2): 5 mg via INTRAVENOUS

## 2013-12-28 MED ORDER — FENTANYL CITRATE 0.05 MG/ML IJ SOLN
INTRAMUSCULAR | Status: AC
  Filled 2013-12-28: qty 2

## 2013-12-28 MED ORDER — DEXAMETHASONE SODIUM PHOSPHATE 4 MG/ML IJ SOLN
4.0000 mg | Freq: Once | INTRAMUSCULAR | Status: AC
  Administered 2013-12-28: 4 mg via INTRAVENOUS
  Filled 2013-12-28: qty 1

## 2013-12-28 MED ORDER — ONDANSETRON HCL 4 MG/2ML IJ SOLN
4.0000 mg | Freq: Once | INTRAMUSCULAR | Status: DC | PRN
  Filled 2013-12-28: qty 2

## 2013-12-28 MED ORDER — MIDAZOLAM HCL 2 MG/2ML IJ SOLN
INTRAMUSCULAR | Status: AC
  Filled 2013-12-28: qty 2

## 2013-12-28 MED ORDER — ONDANSETRON HCL 4 MG/2ML IJ SOLN
4.0000 mg | Freq: Once | INTRAMUSCULAR | Status: AC
  Administered 2013-12-28: 4 mg via INTRAVENOUS
  Filled 2013-12-28: qty 2

## 2013-12-28 MED ORDER — NEOSTIGMINE METHYLSULFATE 10 MG/10ML IV SOLN
INTRAVENOUS | Status: DC | PRN
  Administered 2013-12-28 (×3): 1 mg via INTRAVENOUS

## 2013-12-28 MED ORDER — CHLORHEXIDINE GLUCONATE 4 % EX LIQD: 60.0000 mL | Freq: Once | CUTANEOUS | Status: DC

## 2013-12-28 MED ORDER — OXYCODONE HCL 5 MG PO TABS
10.0000 mg | ORAL_TABLET | Freq: Once | ORAL | Status: AC
  Administered 2013-12-28: 10 mg via ORAL

## 2013-12-28 MED ORDER — ROCURONIUM BROMIDE 50 MG/5ML IV SOLN
INTRAVENOUS | Status: AC
  Filled 2013-12-28: qty 1

## 2013-12-28 MED ORDER — GLYCOPYRROLATE 0.2 MG/ML IJ SOLN
0.2000 mg | Freq: Once | INTRAMUSCULAR | Status: AC
  Administered 2013-12-28: 0.2 mg via INTRAVENOUS
  Filled 2013-12-28: qty 1

## 2013-12-28 MED ORDER — LABETALOL HCL 5 MG/ML IV SOLN
INTRAVENOUS | Status: AC
  Filled 2013-12-28: qty 4

## 2013-12-28 MED ORDER — LIDOCAINE HCL 1 % IJ SOLN
INTRAMUSCULAR | Status: DC | PRN
  Administered 2013-12-28: 50 mg via INTRADERMAL

## 2013-12-28 MED ORDER — ONDANSETRON HCL 4 MG/2ML IJ SOLN
INTRAMUSCULAR | Status: AC
  Filled 2013-12-28: qty 2

## 2013-12-28 MED ORDER — ONDANSETRON HCL 4 MG/2ML IJ SOLN
4.0000 mg | Freq: Once | INTRAMUSCULAR | Status: AC
  Administered 2013-12-28: 4 mg via INTRAVENOUS

## 2013-12-28 MED ORDER — SODIUM CHLORIDE 0.9 % IN NEBU
INHALATION_SOLUTION | RESPIRATORY_TRACT | Status: AC
  Filled 2013-12-28: qty 3

## 2013-12-28 MED ORDER — FENTANYL CITRATE 0.05 MG/ML IJ SOLN
INTRAMUSCULAR | Status: DC | PRN
  Administered 2013-12-28: 50 ug via INTRAVENOUS
  Administered 2013-12-28: 100 ug via INTRAVENOUS
  Administered 2013-12-28 (×7): 50 ug via INTRAVENOUS

## 2013-12-28 MED ORDER — BUPIVACAINE-EPINEPHRINE (PF) 0.5% -1:200000 IJ SOLN
INTRAMUSCULAR | Status: AC
  Filled 2013-12-28: qty 60

## 2013-12-28 MED ORDER — MIDAZOLAM HCL 2 MG/2ML IJ SOLN
1.0000 mg | INTRAMUSCULAR | Status: DC | PRN
Start: 2013-12-28 — End: 2013-12-28
  Administered 2013-12-28: 1 mg via INTRAVENOUS
  Administered 2013-12-28: 2 mg via INTRAVENOUS
  Filled 2013-12-28 (×2): qty 2

## 2013-12-28 SURGICAL SUPPLY — 58 items
BAG HAMPER (MISCELLANEOUS) ×4 IMPLANT
BANDAGE ELASTIC 6 VELCRO NS (GAUZE/BANDAGES/DRESSINGS) IMPLANT
BANDAGE ESMARK 4X12 BL STRL LF (DISPOSABLE) ×2 IMPLANT
BLADE 10 SAFETY STRL DISP (BLADE) IMPLANT
BLADE SURG 15 STRL LF DISP TIS (BLADE) ×2 IMPLANT
BLADE SURG 15 STRL SS (BLADE) ×2
BLADE SURG SZ10 CARB STEEL (BLADE) ×4 IMPLANT
BNDG ESMARK 4X12 BLUE STRL LF (DISPOSABLE) ×4
CHLORAPREP W/TINT 26ML (MISCELLANEOUS) IMPLANT
CLOTH BEACON ORANGE TIMEOUT ST (SAFETY) ×4 IMPLANT
COVER LIGHT HANDLE STERIS (MISCELLANEOUS) ×8 IMPLANT
CUFF TOURNIQUET SINGLE 24IN (TOURNIQUET CUFF) IMPLANT
CUFF TOURNIQUET SINGLE 34IN LL (TOURNIQUET CUFF) ×4 IMPLANT
CUFF TOURNIQUET SINGLE 44IN (TOURNIQUET CUFF) IMPLANT
DECANTER SPIKE VIAL GLASS SM (MISCELLANEOUS) ×4 IMPLANT
DRAPE C-ARM FOLDED MOBILE STRL (DRAPES) ×4 IMPLANT
DRAPE OEC MINIVIEW 54X84 (DRAPES) IMPLANT
ELECT REM PT RETURN 9FT ADLT (ELECTROSURGICAL) ×4
ELECTRODE REM PT RTRN 9FT ADLT (ELECTROSURGICAL) ×2 IMPLANT
GAUZE XEROFORM 5X9 LF (GAUZE/BANDAGES/DRESSINGS) ×4 IMPLANT
GLOVE BIO SURGEON STRL SZ 6.5 (GLOVE) ×6 IMPLANT
GLOVE BIO SURGEONS STRL SZ 6.5 (GLOVE) ×2
GLOVE BIOGEL PI IND STRL 7.0 (GLOVE) ×6 IMPLANT
GLOVE BIOGEL PI IND STRL 7.5 (GLOVE) ×2 IMPLANT
GLOVE BIOGEL PI INDICATOR 7.0 (GLOVE) ×6
GLOVE BIOGEL PI INDICATOR 7.5 (GLOVE) ×2
GLOVE SKINSENSE NS SZ8.0 LF (GLOVE) ×2
GLOVE SKINSENSE STRL SZ8.0 LF (GLOVE) ×2 IMPLANT
GLOVE SS N UNI LF 8.5 STRL (GLOVE) ×4 IMPLANT
GOWN STRL REUS W/TWL LRG LVL3 (GOWN DISPOSABLE) ×16 IMPLANT
GOWN STRL REUS W/TWL XL LVL3 (GOWN DISPOSABLE) ×4 IMPLANT
INST SET MINOR BONE (KITS) ×4 IMPLANT
KIT ROOM TURNOVER APOR (KITS) ×4 IMPLANT
MANIFOLD NEPTUNE II (INSTRUMENTS) ×4 IMPLANT
NEEDLE HYPO 21X1 ECLIPSE (NEEDLE) IMPLANT
NEEDLE HYPO 21X1.5 SAFETY (NEEDLE) ×4 IMPLANT
NS IRRIG 1000ML POUR BTL (IV SOLUTION) ×8 IMPLANT
PACK BASIC LIMB (CUSTOM PROCEDURE TRAY) ×4 IMPLANT
PAD ABD 5X9 TENDERSORB (GAUZE/BANDAGES/DRESSINGS) ×4 IMPLANT
PAD ARMBOARD 7.5X6 YLW CONV (MISCELLANEOUS) ×4 IMPLANT
PAD CAST 4YDX4 CTTN HI CHSV (CAST SUPPLIES) ×2 IMPLANT
PADDING CAST COTTON 4X4 STRL (CAST SUPPLIES) ×2
PADDING WEBRIL 4 STERILE (GAUZE/BANDAGES/DRESSINGS) ×12 IMPLANT
SET BASIN LINEN APH (SET/KITS/TRAYS/PACK) ×4 IMPLANT
SPONGE GAUZE 4X4 12PLY (GAUZE/BANDAGES/DRESSINGS) ×4 IMPLANT
SPONGE LAP 18X18 X RAY DECT (DISPOSABLE) IMPLANT
SPONGE LAP 4X18 X RAY DECT (DISPOSABLE) ×4 IMPLANT
STAPLER VISISTAT 35W (STAPLE) IMPLANT
SUT ETHIBOND NAB OS 4 #2 30IN (SUTURE) ×4 IMPLANT
SUT ETHILON 3 0 FSL (SUTURE) ×4 IMPLANT
SUT MON AB 0 CT1 (SUTURE) ×4 IMPLANT
SUT MON AB 2-0 CT1 36 (SUTURE) IMPLANT
SUT MON AB 2-0 SH 27 (SUTURE) ×2
SUT MON AB 2-0 SH27 (SUTURE) ×2 IMPLANT
SUT PROLENE 4 0 PS 2 18 (SUTURE) IMPLANT
SYR 3ML LL SCALE MARK (SYRINGE) IMPLANT
SYR BULB IRRIGATION 50ML (SYRINGE) ×8 IMPLANT
TAPE CAST 4X4 WHT DELT L NS LF (CAST SUPPLIES) ×12 IMPLANT

## 2013-12-28 NOTE — Anesthesia Procedure Notes (Signed)
Procedure Name: Intubation Date/Time: 12/28/2013 11:01 AM Performed by: Despina HiddenIDACAVAGE, Gianny Sabino J Pre-anesthesia Checklist: Patient identified, Emergency Drugs available, Suction available and Patient being monitored Patient Re-evaluated:Patient Re-evaluated prior to inductionOxygen Delivery Method: Circle system utilized Preoxygenation: Pre-oxygenation with 100% oxygen Intubation Type: IV induction Ventilation: Mask ventilation without difficulty and Oral airway inserted - appropriate to patient size Laryngoscope Size: Mac and 3 Grade View: Grade I Tube type: Oral Tube size: 7.0 mm Number of attempts: 1 Airway Equipment and Method: Stylet Placement Confirmation: ETT inserted through vocal cords under direct vision,  positive ETCO2 and breath sounds checked- equal and bilateral Secured at: 22 cm Tube secured with: Tape Dental Injury: Teeth and Oropharynx as per pre-operative assessment

## 2013-12-28 NOTE — Op Note (Signed)
12/28/2013  12:31 PM  PATIENT:  [name]  42 y.o. 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 The left ankle was marked as a surgical site. Patient taken to the operating room and given general anesthesia and 2 g of IV Ancef. She was then placed in the prone position with appropriate padding  The left ankle and foot were prepped and draped up to the level of the knee using Betadine scrub and Betadine paint. Timeout was executed. The foot was wrapped to seal it from the remaining portions of the wound. The limb was exsanguinated with a 4 to Esmarch and the tourniquet was elevated to 20 mmHg.  The C-arm was brought in to assist in finding the syndesmosis screw. Once this was located a small incision was made and the screw was removed. This incision was extended proximally and distally where there were 2 superficial wound breakdowns which were debrided. This wound was irrigated and closed with interrupted nylon suture  A midline incision was then made over the posterior aspect of the lower leg. Full-thickness skin flaps were established by taken the incision down to the Achilles tendon. A Z-lengthening was performed. The proximal limb was taken medially the distal limb was taken laterally and they were both extended through the midline. The anterior surface of the tendon was then debrided of any muscular tissue. The foot was brought into 5 of dorsiflexion which left 2 cm of overlap between the 2 Achilles  tendon ends. This was then sutured with 4 #2 Ethibond suture in interrupted fashion.  After dressing the wound the foot was placed in a short leg cast with 5 of dorsiflexion.  SURGEON:  Surgeon(s) and Role:    * Sharryn Belding E Jahnai Slingerland, MD - Primary  PHYSICIAN ASSISTANT:   ASSISTANTS: betty ashley    ANESTHESIA:   general  EBL:  Total I/O In: 1800 [I.V.:1800] Out: -   BLOOD ADMINISTERED:none  DRAINS: none   LOCAL MEDICATIONS USED:  MARCAINE     SPECIMEN:  No Specimen  DISPOSITION OF SPECIMEN:  N/A  COUNTS:  YES  TOURNIQUET:  * Missing tourniquet times found for documented tourniquets in log:  [id number] *  DICTATION: .Dragon Dictation  PLAN OF CARE: Discharge to home after PACU  PATIENT DISPOSITION:  PACU - hemodynamically stable.   Delay start of Pharmacological VTE agent (>24hrs) due to surgical blood loss or risk of bleeding: not applicable  

## 2013-12-28 NOTE — Anesthesia Postprocedure Evaluation (Signed)
  Anesthesia Post-op Note  Patient: [name]  Procedure(s) Performed: Procedure(s) with comments: HARDWARE REMOVAL LEFT ANKLE (Left) - left ankle ACHILLES TENDON LENGTHENING LEFT (Left) CAST APPLICATION LEFT ANKLE (Left)  Patient Location: PACU  Anesthesia Type:General  Level of Consciousness: awake, alert , oriented and patient cooperative  Airway and Oxygen Therapy: Patient Spontanous Breathing  Post-op Pain: 4 /10, moderate  Post-op Assessment: Post-op Vital signs reviewed, Patient's Cardiovascular Status Stable, Respiratory Function Stable, Patent Airway, No signs of Nausea or vomiting and Adequate PO intake  Post-op Vital Signs: Reviewed and stable  Last Vitals:  Filed Vitals:   12/28/13 1230  BP: 135/94  Pulse:   Temp: 37 C  Resp: 14    Complications: No apparent anesthesia complications

## 2013-12-28 NOTE — Transfer of Care (Signed)
Immediate Anesthesia Transfer of Care Note  Patient: Joyce PihMaria L Cox  Procedure(s) Performed: Procedure(s) with comments: HARDWARE REMOVAL LEFT ANKLE (Left) - left ankle ACHILLES TENDON LENGTHENING LEFT (Left)  Patient Location: PACU  Anesthesia Type:General  Level of Consciousness: awake and patient cooperative  Airway & Oxygen Therapy: Patient Spontanous Breathing and Patient connected to face mask oxygen  Post-op Assessment: Report given to PACU RN, Post -op Vital signs reviewed and stable and Patient moving all extremities  Post vital signs: Reviewed and stable  Complications: No apparent anesthesia complications

## 2013-12-28 NOTE — Interval H&P Note (Signed)
History and Physical Interval Note:  12/28/2013 10:24 AM  [name]  has presented today for surgery, with the diagnosis of Trimalleolar fracture of left ankle  The various methods of treatment have been discussed with the patient and family. After consideration of risks, benefits and other options for treatment, the patient has consented to  Procedure(s) with comments: HARDWARE REMOVAL (Left) - left ankle ACHILLES TENDON LENGTHENING (Left) as a surgical intervention .  The patient's history has been reviewed, patient examined, no change in status, stable for surgery.  I have reviewed the patient's chart and labs.  Questions were answered to the patient's satisfaction.     Fuller CanadaStanley Harrison

## 2013-12-28 NOTE — Interval H&P Note (Signed)
History and Physical Interval Note:  12/28/2013 10:26 AM  [name]  has presented today for surgery, with the diagnosis of Trimalleolar fracture of left ankle  The various methods of treatment have been discussed with the patient and family. After consideration of risks, benefits and other options for treatment, the patient has consented to  Procedure(s) with comments: HARDWARE REMOVAL (Left) - left ankle ACHILLES TENDON LENGTHENING (Left) as a surgical intervention .  The patient's history has been reviewed, patient examined, no change in status, stable for surgery.  I have reviewed the patient's chart and labs.  Questions were answered to the patient's satisfaction.     [name]

## 2013-12-28 NOTE — Anesthesia Preprocedure Evaluation (Signed)
Anesthesia Evaluation  Patient identified by MRN, date of birth, ID band Patient awake    Reviewed: Allergy & Precautions, H&P , NPO status , Patient's Chart, lab work & pertinent test results  Airway Mallampati: III  TM Distance: >3 FB     Dental  (+) Teeth Intact   Pulmonary asthma , Current Smoker,  breath sounds clear to auscultation        Cardiovascular hypertension, Pt. on medications Rhythm:Regular Rate:Normal     Neuro/Psych    GI/Hepatic negative GI ROS,   Endo/Other    Renal/GU      Musculoskeletal   Abdominal   Peds  Hematology   Anesthesia Other Findings   Reproductive/Obstetrics                             Anesthesia Physical Anesthesia Plan  ASA: II  Anesthesia Plan: General   Post-op Pain Management:    Induction: Intravenous  Airway Management Planned: Oral ETT  Additional Equipment:   Intra-op Plan:   Post-operative Plan: Extubation in OR  Informed Consent: I have reviewed the patients History and Physical, chart, labs and discussed the procedure including the risks, benefits and alternatives for the proposed anesthesia with the patient or authorized representative who has indicated his/her understanding and acceptance.     Plan Discussed with:   Anesthesia Plan Comments:         Anesthesia Quick Evaluation

## 2013-12-28 NOTE — Brief Op Note (Addendum)
12/28/2013  12:31 PM  PATIENT:  Annice PihMaria L Trexler  43 y.o. 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 The left ankle was marked as a surgical site. Patient taken to the operating room and given general anesthesia and 2 g of IV Ancef. She was then placed in the prone position with appropriate padding  The left ankle and foot were prepped and draped up to the level of the knee using Betadine scrub and Betadine paint. Timeout was executed. The foot was wrapped to seal it from the remaining portions of the wound. The limb was exsanguinated with a 4 to Esmarch and the tourniquet was elevated to 20 mmHg.  The C-arm was brought in to assist in finding the syndesmosis screw. Once this was located a small incision was made and the screw was removed. This incision was extended proximally and distally where there were 2 superficial wound breakdowns which were debrided. This wound was irrigated and closed with interrupted nylon suture  A midline incision was then made over the posterior aspect of the lower leg. Full-thickness skin flaps were established by taken the incision down to the Achilles tendon. A Z-lengthening was performed. The proximal limb was taken medially the distal limb was taken laterally and they were both extended through the midline. The anterior surface of the tendon was then debrided of any muscular tissue. The foot was brought into 5 of dorsiflexion which left 2 cm of overlap between the 2 Achilles  tendon ends. This was then sutured with 4 #2 Ethibond suture in interrupted fashion.  After dressing the wound the foot was placed in a short leg cast with 5 of dorsiflexion.  SURGEON:  Surgeon(s) and Role:    * Vickki HearingStanley E Ziah Turvey, MD - Primary  PHYSICIAN ASSISTANT:   ASSISTANTS: betty ashley    ANESTHESIA:   general  EBL:  Total I/O In: 1800 [I.V.:1800] Out: -   BLOOD ADMINISTERED:none  DRAINS: none   LOCAL MEDICATIONS USED:  MARCAINE     SPECIMEN:  No Specimen  DISPOSITION OF SPECIMEN:  N/A  COUNTS:  YES  TOURNIQUET:  * Missing tourniquet times found for documented tourniquets in log:  [id number] *  DICTATION: .Dragon Dictation  PLAN OF CARE: Discharge to home after PACU  PATIENT DISPOSITION:  PACU - hemodynamically stable.   Delay start of Pharmacological VTE agent (>24hrs) due to surgical blood loss or risk of bleeding: not applicable

## 2013-12-29 ENCOUNTER — Encounter (HOSPITAL_COMMUNITY): Payer: Self-pay | Admitting: Orthopedic Surgery

## 2014-01-02 ENCOUNTER — Encounter: Payer: Self-pay | Admitting: Orthopedic Surgery

## 2014-01-02 ENCOUNTER — Ambulatory Visit (INDEPENDENT_AMBULATORY_CARE_PROVIDER_SITE_OTHER): Payer: Self-pay | Admitting: Orthopedic Surgery

## 2014-01-02 VITALS — BP 140/92 | Ht 61.0 in | Wt 132.0 lb

## 2014-01-02 DIAGNOSIS — M24569 Contracture, unspecified knee: Secondary | ICD-10-CM

## 2014-01-02 DIAGNOSIS — S82892A Other fracture of left lower leg, initial encounter for closed fracture: Secondary | ICD-10-CM

## 2014-01-02 DIAGNOSIS — S82892D Other fracture of left lower leg, subsequent encounter for closed fracture with routine healing: Secondary | ICD-10-CM

## 2014-01-02 DIAGNOSIS — T8131XD Disruption of external operation (surgical) wound, not elsewhere classified, subsequent encounter: Secondary | ICD-10-CM

## 2014-01-02 DIAGNOSIS — M2450 Contracture, unspecified joint: Secondary | ICD-10-CM

## 2014-01-02 MED ORDER — OXYCODONE HCL 15 MG PO TABS: 15.0000 mg | ORAL_TABLET | ORAL | Status: DC | PRN

## 2014-01-02 NOTE — Progress Notes (Signed)
Patient ID: Joyce PihMaria L Cox, female   DOB: October 21, 1970, 43 y.o.   MRN: [id number]  Chief Complaint  Patient presents with  . Follow-up    post op 1, LEFT ANKLE, DOS 12/28/13    Encounter Diagnoses  Name Primary?  Marland Kitchen. Wound dehiscence, subsequent encounter Yes  . Ankle fracture, left   . Fracture of ankle, left, closed, with routine healing, subsequent encounter   . Joint contracture of lower leg     The patient has had removal of syndesmosis screw left ankle for previous fracture and then she had a Achilles tendon lengthening open technique Z lengthening  Cast intact minimal discomfort I get my finger in the cast no problem  Cast windows in 2 weeks

## 2014-01-11 ENCOUNTER — Ambulatory Visit (INDEPENDENT_AMBULATORY_CARE_PROVIDER_SITE_OTHER): Payer: Medicaid Other | Admitting: Orthopedic Surgery

## 2014-01-11 ENCOUNTER — Encounter: Payer: Self-pay | Admitting: Orthopedic Surgery

## 2014-01-11 VITALS — Ht 61.0 in | Wt 132.0 lb

## 2014-01-11 DIAGNOSIS — M2450 Contracture, unspecified joint: Secondary | ICD-10-CM

## 2014-01-11 DIAGNOSIS — M24569 Contracture, unspecified knee: Secondary | ICD-10-CM

## 2014-01-11 DIAGNOSIS — S82892A Other fracture of left lower leg, initial encounter for closed fracture: Secondary | ICD-10-CM

## 2014-01-11 DIAGNOSIS — S82851S Displaced trimalleolar fracture of right lower leg, sequela: Secondary | ICD-10-CM

## 2014-01-11 MED ORDER — PROMETHAZINE HCL 25 MG PO TABS: 25.0000 mg | ORAL_TABLET | Freq: Four times a day (QID) | ORAL | Status: DC | PRN

## 2014-01-11 NOTE — Progress Notes (Signed)
Patient ID: [name], female   DOB: 1970/03/12, 43 y.o.   MRN: [id number] Chief Complaint  Patient presents with  . Wound Check    wound check, cast change   surgery November 18 Removal of syndesmosis screw right ankle Z-lengthening Achilles tendon right ankle foot   Cast change today the incision over the Achilles tendon looks great staples were removed. The lateral wound shows excellent healing with suture removal  Patient placed in a short leg nonweightbearing cast with the foot in neutral position  She will come back in 3 weeks for cast change  She can start weightbearing at 6 weeks postop

## 2014-01-12 ENCOUNTER — Encounter: Payer: Self-pay | Admitting: Orthopedic Surgery

## 2014-01-12 ENCOUNTER — Ambulatory Visit (INDEPENDENT_AMBULATORY_CARE_PROVIDER_SITE_OTHER): Payer: Medicaid Other | Admitting: Orthopedic Surgery

## 2014-01-12 DIAGNOSIS — M24569 Contracture, unspecified knee: Secondary | ICD-10-CM

## 2014-01-12 DIAGNOSIS — M2450 Contracture, unspecified joint: Secondary | ICD-10-CM

## 2014-01-12 NOTE — Progress Notes (Signed)
Patient ID: [name], female   DOB: 08-16-1970, 43 y.o.   MRN: [id number] Chief Complaint  Patient presents with  . Cast Problem    blood on cast dos nov 18     I changed patient's cast on the second she had some bloody drainage through the cast and presents for evaluation. She denies trauma. No increase in pain.  The cast is sold at the most inferior aspect. Of note we did not place a dressing and she had some bleeding through after we took the staples out. A ABD and 2 inch fiberglass tape was wrapped around the cast to come back in a week for cast check

## 2014-01-16 ENCOUNTER — Other Ambulatory Visit: Payer: Self-pay | Admitting: *Deleted

## 2014-01-16 ENCOUNTER — Telehealth: Payer: Self-pay | Admitting: Orthopedic Surgery

## 2014-01-16 MED ORDER — OXYCODONE HCL 15 MG PO TABS: 15.0000 mg | ORAL_TABLET | ORAL | Status: DC | PRN

## 2014-01-16 NOTE — Telephone Encounter (Signed)
Patient is calling asking for a refill on her pain medication oxyCODONE (ROXICODONE) 15 MG immediate release tablet, please advise?

## 2014-01-16 NOTE — Telephone Encounter (Signed)
Prescription available,called patient, no answer, left vm 

## 2014-01-18 ENCOUNTER — Other Ambulatory Visit: Payer: Self-pay | Admitting: Obstetrics & Gynecology

## 2014-01-18 ENCOUNTER — Ambulatory Visit: Payer: Medicaid Other

## 2014-01-19 ENCOUNTER — Encounter: Payer: Self-pay | Admitting: Orthopedic Surgery

## 2014-01-19 ENCOUNTER — Ambulatory Visit (INDEPENDENT_AMBULATORY_CARE_PROVIDER_SITE_OTHER): Payer: Medicaid Other | Admitting: Orthopedic Surgery

## 2014-01-19 VITALS — BP 112/78 | Ht 61.0 in | Wt 132.0 lb

## 2014-01-19 DIAGNOSIS — S82852S Displaced trimalleolar fracture of left lower leg, sequela: Secondary | ICD-10-CM

## 2014-01-19 DIAGNOSIS — M24569 Contracture, unspecified knee: Secondary | ICD-10-CM

## 2014-01-19 DIAGNOSIS — M2450 Contracture, unspecified joint: Secondary | ICD-10-CM

## 2014-01-19 NOTE — Progress Notes (Signed)
Patient ID: Joyce PihMaria L Trexler, female   DOB: 03/22/1970, 43 y.o.   MRN: [id number]   August 2015 Status post open treatment internal fixation of trimalleolar ankle fracture with syndesmosis screw fixation August 2015,     11/09/2013 status post incision and drainage 11/09/2013    12/28/2013 Status post post removal of syndesmosis screw and Achilles tendon Z-lengthening on November 18 comes in for cast check.   The patient said her cast was smelling bad so we removed the cast her wounds are clean dry and intact there is a small area of blood on the lateral incision the posterior incision is pristine.  Her foot is in plantar flexion and slight supination secondary to tibialis anterior overpull  Patient is placed in a short leg nonweightbearing cast and cast change will be done in 2 weeks  Note she is 22 days postop at this point

## 2014-01-20 ENCOUNTER — Ambulatory Visit (INDEPENDENT_AMBULATORY_CARE_PROVIDER_SITE_OTHER): Payer: Medicaid Other | Admitting: Obstetrics & Gynecology

## 2014-01-20 ENCOUNTER — Encounter: Payer: Self-pay | Admitting: Obstetrics & Gynecology

## 2014-01-20 DIAGNOSIS — Z3042 Encounter for surveillance of injectable contraceptive: Secondary | ICD-10-CM

## 2014-01-20 DIAGNOSIS — Z3202 Encounter for pregnancy test, result negative: Secondary | ICD-10-CM

## 2014-01-20 LAB — POCT URINE PREGNANCY: PREG TEST UR: NEGATIVE

## 2014-01-20 MED ORDER — MEDROXYPROGESTERONE ACETATE 150 MG/ML IM SUSP
150.0000 mg | Freq: Once | INTRAMUSCULAR | Status: AC
  Administered 2014-01-20: 150 mg via INTRAMUSCULAR

## 2014-01-24 ENCOUNTER — Other Ambulatory Visit: Payer: Self-pay | Admitting: *Deleted

## 2014-01-24 ENCOUNTER — Telehealth: Payer: Self-pay | Admitting: Orthopedic Surgery

## 2014-01-24 MED ORDER — OXYCODONE HCL 15 MG PO TABS: 15.0000 mg | ORAL_TABLET | ORAL | Status: DC | PRN

## 2014-01-24 NOTE — Telephone Encounter (Signed)
Patient picked up Rx

## 2014-01-24 NOTE — Telephone Encounter (Signed)
Prescription is available for pick up, called patient, no answer

## 2014-01-24 NOTE — Telephone Encounter (Signed)
Patient called to request refill on pain medication, oxyCODONE (ROXICODONE) 15 MG;  Please advise. Ph # [phone]

## 2014-02-01 ENCOUNTER — Other Ambulatory Visit: Payer: Self-pay | Admitting: *Deleted

## 2014-02-01 ENCOUNTER — Telehealth: Payer: Self-pay | Admitting: Orthopedic Surgery

## 2014-02-01 ENCOUNTER — Ambulatory Visit: Payer: Medicaid Other | Admitting: Orthopedic Surgery

## 2014-02-01 MED ORDER — OXYCODONE HCL 15 MG PO TABS: 15.0000 mg | ORAL_TABLET | ORAL | Status: DC | PRN

## 2014-02-01 NOTE — Telephone Encounter (Signed)
Patient had called approximately 5 minutes prior to her 9:00am appointment today, 02/01/14, to relay that her ride did not arrive to pick her up; she is aware that Dr Romeo AppleHarrison had scheduled this date and time around surgery schedule.  By 10:10am, patient was only about half-way to our office; therefore, needs to be re-scheduled, which has been done for Monday, 02/06/14, following Christmas holiday.  She is asking about pain medication refill, Oxycodone 15.  Aware Dr. Romeo AppleHarrison is not available in clinic today.  Please advise.

## 2014-02-06 ENCOUNTER — Ambulatory Visit: Payer: Medicaid Other | Admitting: Orthopedic Surgery

## 2014-02-07 ENCOUNTER — Ambulatory Visit (INDEPENDENT_AMBULATORY_CARE_PROVIDER_SITE_OTHER): Payer: Medicaid Other | Admitting: Orthopedic Surgery

## 2014-02-07 ENCOUNTER — Encounter: Payer: Self-pay | Admitting: Orthopedic Surgery

## 2014-02-07 VITALS — BP 144/100 | Ht 61.0 in | Wt 132.0 lb

## 2014-02-07 DIAGNOSIS — M24569 Contracture, unspecified knee: Secondary | ICD-10-CM

## 2014-02-07 DIAGNOSIS — S82852S Displaced trimalleolar fracture of left lower leg, sequela: Secondary | ICD-10-CM

## 2014-02-07 NOTE — Progress Notes (Signed)
Patient ID: [name], female   DOB: 16-Apr-1970, 43 y.o.   MRN: [id number] Postoperative week #6 status post open Achilles tendon lengthening status post fracture dislocation of the left ankle with internal fixation  Syndesmosis screw is been removed and the Achilles tendon has been lengthened.  Wounds are clean dry and intact except for one pinpoint area posteriorly and one pinpoint area laterally this is covered with Band-Aid  Foot is plantigrade with some supination  New weightbearing cast is applied  Follow-up 3 weeks. Repeat cast

## 2014-02-14 ENCOUNTER — Other Ambulatory Visit: Payer: Self-pay | Admitting: *Deleted

## 2014-02-14 ENCOUNTER — Telehealth: Payer: Self-pay | Admitting: Orthopedic Surgery

## 2014-02-14 MED ORDER — OXYCODONE HCL 15 MG PO TABS: 15.0000 mg | ORAL_TABLET | ORAL | Status: DC | PRN

## 2014-02-14 NOTE — Telephone Encounter (Signed)
Notified patient prescription is ready - states will pick up by tomorrow morning, 02/15/14.

## 2014-02-14 NOTE — Telephone Encounter (Signed)
Patient called to request refill of medication: oxyCODONE (ROXICODONE) 15 MG immediate release tablet [[id number]][[id number]]  Her phone # is [phone]

## 2014-02-15 NOTE — Telephone Encounter (Signed)
Patient picked up Rx

## 2014-02-22 ENCOUNTER — Other Ambulatory Visit: Payer: Self-pay | Admitting: *Deleted

## 2014-02-22 ENCOUNTER — Telehealth: Payer: Self-pay | Admitting: Orthopedic Surgery

## 2014-02-22 MED ORDER — OXYCODONE HCL 15 MG PO TABS: 15.0000 mg | ORAL_TABLET | ORAL | Status: DC | PRN

## 2014-02-22 NOTE — Telephone Encounter (Signed)
Patient is calling requesting pain medication refill oxyCODONE (ROXICODONE) 15 MG immediate release tablet [[id number]][[id number]] please advise?

## 2014-02-23 NOTE — Telephone Encounter (Signed)
Patient picked up Rx

## 2014-02-23 NOTE — Telephone Encounter (Signed)
Prescription is available for pick up, tried to call no answer

## 2014-02-28 ENCOUNTER — Encounter: Payer: Self-pay | Admitting: Orthopedic Surgery

## 2014-02-28 ENCOUNTER — Ambulatory Visit (INDEPENDENT_AMBULATORY_CARE_PROVIDER_SITE_OTHER): Payer: Medicaid Other | Admitting: Orthopedic Surgery

## 2014-02-28 VITALS — BP 107/68 | Ht 61.0 in | Wt 132.0 lb

## 2014-02-28 DIAGNOSIS — T8131XD Disruption of external operation (surgical) wound, not elsewhere classified, subsequent encounter: Secondary | ICD-10-CM

## 2014-02-28 DIAGNOSIS — M2450 Contracture, unspecified joint: Secondary | ICD-10-CM

## 2014-02-28 DIAGNOSIS — M24569 Contracture, unspecified knee: Secondary | ICD-10-CM

## 2014-02-28 DIAGNOSIS — S82852S Displaced trimalleolar fracture of left lower leg, sequela: Secondary | ICD-10-CM

## 2014-02-28 MED ORDER — OXYCODONE HCL 15 MG PO TABS: 15.0000 mg | ORAL_TABLET | ORAL | Status: DC | PRN

## 2014-02-28 NOTE — Progress Notes (Signed)
Patient ID: [name], female   DOB: 1970/09/07, 44 y.o.   MRN: [id number] Chief Complaint  Patient presents with  . Follow-up    3 week follow up cast OFF left ankle, dos 12/28/13    Postop visit  Approximate 9 week status post removal of syndesmosis screw and Achilles tendon lengthening open technique.  Last visit she was advised to start weightbearing as tolerated in the cast. She did not comply with that. Comes back in for recheck. She has 2 pinpoint areas of hypertrophic soft tissue which is treated with Xeroform dressing. Her wounds are otherwise clean dry and intact her foot is plantigrade except for some mild supination  She is placed back in a cast weightbearing as tolerated and advised to start weightbearing as tolerated with a walker come back 3 weeks and then we'll try to go back to the boot if she's been compliant.

## 2014-03-21 ENCOUNTER — Encounter: Payer: Self-pay | Admitting: Orthopedic Surgery

## 2014-03-21 ENCOUNTER — Ambulatory Visit (INDEPENDENT_AMBULATORY_CARE_PROVIDER_SITE_OTHER): Payer: Self-pay | Admitting: Orthopedic Surgery

## 2014-03-21 ENCOUNTER — Ambulatory Visit (INDEPENDENT_AMBULATORY_CARE_PROVIDER_SITE_OTHER): Payer: Medicaid Other

## 2014-03-21 VITALS — Ht 61.0 in | Wt 132.0 lb

## 2014-03-21 DIAGNOSIS — S82892A Other fracture of left lower leg, initial encounter for closed fracture: Secondary | ICD-10-CM

## 2014-03-21 DIAGNOSIS — T8131XD Disruption of external operation (surgical) wound, not elsewhere classified, subsequent encounter: Secondary | ICD-10-CM

## 2014-03-21 DIAGNOSIS — M2450 Contracture, unspecified joint: Secondary | ICD-10-CM

## 2014-03-21 DIAGNOSIS — M24569 Contracture, unspecified knee: Secondary | ICD-10-CM

## 2014-03-21 DIAGNOSIS — S82852S Displaced trimalleolar fracture of left lower leg, sequela: Secondary | ICD-10-CM

## 2014-03-21 MED ORDER — OXYCODONE HCL 15 MG PO TABS: 15.0000 mg | ORAL_TABLET | ORAL | Status: DC | PRN

## 2014-03-21 NOTE — Addendum Note (Signed)
Addended by: Adella HareBOOTHE, JAIME B on: 03/21/2014 09:59 AM   Modules accepted: Orders

## 2014-03-21 NOTE — Patient Instructions (Signed)
Weight-bear as tolerated in your brace with her walker  Remove brace 3 times a day and move the foot up and down 50 times each time

## 2014-03-21 NOTE — Progress Notes (Signed)
Chief Complaint  Patient presents with  . Follow-up    3 week recheck Left ankle + xray, cast off, DOS 12/28/13   Encounter Diagnoses  Name Primary?  Marland Kitchen. Ankle fracture, left Yes  . Trimalleolar fracture of ankle, closed, left, sequela   . Wound dehiscence, subsequent encounter   . Joint contracture of lower leg    The patient is now status post removal of syndesmosis screw and Achilles tendon lengthening  Today's x-ray shows osteopenia of the distal tibia some halo formation around some of the distal screws on the fibula  She did try weightbearing in her cast felt some pain decided to stop  No tenderness in the ankle joint today she is asked she got some Achilles tendon function and ankle motion. Her supination deformity is mild.  Recommend Cam Walker try weightbearing as tolerated again recheck in 3 weeks

## 2014-04-03 ENCOUNTER — Telehealth: Payer: Self-pay | Admitting: Orthopedic Surgery

## 2014-04-03 ENCOUNTER — Other Ambulatory Visit: Payer: Self-pay | Admitting: *Deleted

## 2014-04-03 MED ORDER — OXYCODONE HCL 15 MG PO TABS: 15.0000 mg | ORAL_TABLET | ORAL | Status: DC | PRN

## 2014-04-03 NOTE — Telephone Encounter (Signed)
Patient called to request refill of pain medication:   oxyCODONE (ROXICODONE) 15 MG immediate release tablet [[id number]][[id number]]      - her phone # is [phone]

## 2014-04-04 NOTE — Telephone Encounter (Signed)
Patient picked up her Rx.

## 2014-04-04 NOTE — Telephone Encounter (Signed)
Prescription available, patient aware  

## 2014-04-11 ENCOUNTER — Other Ambulatory Visit: Payer: Self-pay | Admitting: *Deleted

## 2014-04-11 ENCOUNTER — Ambulatory Visit (INDEPENDENT_AMBULATORY_CARE_PROVIDER_SITE_OTHER): Payer: Medicaid Other | Admitting: Orthopedic Surgery

## 2014-04-11 VITALS — BP 151/109 | Ht 61.0 in | Wt 132.0 lb

## 2014-04-11 DIAGNOSIS — S82852S Displaced trimalleolar fracture of left lower leg, sequela: Secondary | ICD-10-CM

## 2014-04-11 DIAGNOSIS — T8131XD Disruption of external operation (surgical) wound, not elsewhere classified, subsequent encounter: Secondary | ICD-10-CM

## 2014-04-11 MED ORDER — OXYCODONE HCL 15 MG PO TABS: 15.0000 mg | ORAL_TABLET | ORAL | Status: DC | PRN

## 2014-04-11 NOTE — Progress Notes (Signed)
Follow-up visit. The patient has had open treatment internal fixation left ankle followed by syndesmosis screw removal and Achilles tendon lengthening  She is now in a [employer] Walker she is on active range of motion exercises she is weightbearing as tolerated she is taking oxycodone 15 mg IR once or twice a day she is using her walker her wounds are all healed she will follow-up with us in 6 weeks  She has 2 areas of decreased sensation one is on the lateral cutaneous nerve of the foot and one is over the posterior aspect of the lower leg  She will use a toothbrush on these. She has active dorsiflexion plantar flexion now in the foot with intact Achilles tendon so she is doing very well progressing finally towards healing.

## 2014-04-14 ENCOUNTER — Ambulatory Visit (INDEPENDENT_AMBULATORY_CARE_PROVIDER_SITE_OTHER): Payer: Medicaid Other | Admitting: *Deleted

## 2014-04-14 ENCOUNTER — Encounter: Payer: Self-pay | Admitting: *Deleted

## 2014-04-14 DIAGNOSIS — Z3202 Encounter for pregnancy test, result negative: Secondary | ICD-10-CM

## 2014-04-14 DIAGNOSIS — Z3042 Encounter for surveillance of injectable contraceptive: Secondary | ICD-10-CM | POA: Diagnosis not present

## 2014-04-14 LAB — POCT URINE PREGNANCY: Preg Test, Ur: NEGATIVE

## 2014-04-14 MED ORDER — MEDROXYPROGESTERONE ACETATE 150 MG/ML IM SUSP
150.0000 mg | Freq: Once | INTRAMUSCULAR | Status: AC
  Administered 2014-04-14: 150 mg via INTRAMUSCULAR

## 2014-04-14 NOTE — Progress Notes (Signed)
Pt here for Depo shot. Reports no problems at this time. Return in 12 weeks for next shot. JSY 

## 2014-05-08 ENCOUNTER — Telehealth: Payer: Self-pay | Admitting: Orthopedic Surgery

## 2014-05-08 NOTE — Telephone Encounter (Signed)
Patient called to request refill of medication: oxyCODONE (ROXICODONE) 15 MG immediate release tablet [[id number]][[id number]] - [phone]

## 2014-05-11 ENCOUNTER — Other Ambulatory Visit: Payer: Self-pay | Admitting: *Deleted

## 2014-05-11 MED ORDER — OXYCODONE HCL 15 MG PO TABS: 15.0000 mg | ORAL_TABLET | ORAL | Status: DC | PRN

## 2014-05-15 NOTE — Telephone Encounter (Signed)
PATIENT PICKED UP RX 

## 2014-05-15 NOTE — Telephone Encounter (Signed)
Prescription available, patient aware  

## 2014-05-23 ENCOUNTER — Encounter: Payer: Self-pay | Admitting: Orthopedic Surgery

## 2014-05-23 ENCOUNTER — Ambulatory Visit (INDEPENDENT_AMBULATORY_CARE_PROVIDER_SITE_OTHER): Payer: Medicaid Other | Admitting: Orthopedic Surgery

## 2014-05-23 VITALS — BP 124/88 | Ht 61.0 in | Wt 132.0 lb

## 2014-05-23 DIAGNOSIS — S82892A Other fracture of left lower leg, initial encounter for closed fracture: Secondary | ICD-10-CM | POA: Diagnosis not present

## 2014-05-23 DIAGNOSIS — S82852S Displaced trimalleolar fracture of left lower leg, sequela: Secondary | ICD-10-CM

## 2014-05-23 MED ORDER — OXYCODONE HCL 15 MG PO TABS: 15.0000 mg | ORAL_TABLET | ORAL | Status: DC | PRN

## 2014-05-23 MED ORDER — IBUPROFEN 800 MG PO TABS: 800.0000 mg | ORAL_TABLET | Freq: Three times a day (TID) | ORAL | Status: DC | PRN

## 2014-05-23 NOTE — Progress Notes (Signed)
Patient ID: Joyce PihMaria L Trexler, female   DOB: Jul 18, 1970, 44 y.o.   MRN: [id number] Follow-up visit chief complaint recheck left ankle  The patient is status post open treatment internal fixation trimalleolar fracture with syndesmosis screw fixation August 2015. 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

## 2014-05-23 NOTE — Patient Instructions (Signed)
CALL [facility] TO SCHEDULE THERAPY

## 2014-06-12 ENCOUNTER — Other Ambulatory Visit: Payer: Self-pay | Admitting: *Deleted

## 2014-06-12 ENCOUNTER — Telehealth: Payer: Self-pay | Admitting: Orthopedic Surgery

## 2014-06-12 MED ORDER — OXYCODONE HCL 15 MG PO TABS: 15.0000 mg | ORAL_TABLET | ORAL | Status: DC | PRN

## 2014-06-12 NOTE — Telephone Encounter (Signed)
Patient requests refill of medication:  oxyCODONE (ROXICODONE) 15 MG immediate release tablet [[id number]][[id number]] - ph# [phone]

## 2014-06-12 NOTE — Telephone Encounter (Signed)
Prescription available, patient aware  

## 2014-06-12 NOTE — Telephone Encounter (Signed)
Patient picked up Rx

## 2014-06-22 ENCOUNTER — Telehealth: Payer: Self-pay | Admitting: Orthopedic Surgery

## 2014-06-22 NOTE — Telephone Encounter (Signed)
Patient is requesting a refill on pain medication oxyCODONE (ROXICODONE) 15 MG immediate release tablet please advise?

## 2014-06-22 NOTE — Telephone Encounter (Signed)
Is it time to start stepping this one down?

## 2014-06-22 NOTE — Telephone Encounter (Signed)
No she has chronic pain and she is going to be a long-term player

## 2014-06-26 ENCOUNTER — Other Ambulatory Visit: Payer: Self-pay | Admitting: *Deleted

## 2014-06-26 MED ORDER — OXYCODONE HCL 15 MG PO TABS: 15.0000 mg | ORAL_TABLET | ORAL | Status: DC | PRN

## 2014-06-26 NOTE — Telephone Encounter (Signed)
Patient picked up Rx

## 2014-07-04 ENCOUNTER — Ambulatory Visit (INDEPENDENT_AMBULATORY_CARE_PROVIDER_SITE_OTHER): Payer: Medicaid Other | Admitting: Orthopedic Surgery

## 2014-07-04 ENCOUNTER — Encounter: Payer: Self-pay | Admitting: Orthopedic Surgery

## 2014-07-04 VITALS — BP 144/96 | Ht 61.0 in | Wt 132.0 lb

## 2014-07-04 DIAGNOSIS — M24569 Contracture, unspecified knee: Secondary | ICD-10-CM

## 2014-07-04 DIAGNOSIS — M2450 Contracture, unspecified joint: Secondary | ICD-10-CM | POA: Diagnosis not present

## 2014-07-04 DIAGNOSIS — G90522 Complex regional pain syndrome I of left lower limb: Secondary | ICD-10-CM | POA: Diagnosis not present

## 2014-07-04 DIAGNOSIS — S82852S Displaced trimalleolar fracture of left lower leg, sequela: Secondary | ICD-10-CM

## 2014-07-04 MED ORDER — OXYCODONE HCL 15 MG PO TABS: 15.0000 mg | ORAL_TABLET | ORAL | Status: DC | PRN

## 2014-07-04 NOTE — Patient Instructions (Signed)
Warm water soaks and up and down stretches

## 2014-07-04 NOTE — Progress Notes (Signed)
Encounter Diagnoses  Name Primary?  . Trimalleolar fracture of ankle, closed, left, sequela Yes  . Joint contracture of lower leg     Chief Complaint  Patient presents with  . Follow-up    6 week recheck on left ankle fracture, DOS 12-28-13.    The patient cannot give physical therapy  She's currently in a short Cam Walker and still has significant tibialis anterior muscle spasm. I did some contracture release exercises with her today but obviously I cannot perform the therapy for her or do her therapy. Medicaid will not allow to have therapy and even if they did it's only 3 visits  Review of systems persistent paresthesias dorsum of the foot  So she has to continue with home exercises, Cam Walker, oxycodone 15 mg every 4 to control pain along with ibuprofen.  Cam Walker weightbearing as tolerated  Follow with me every month  She may indeed need a tibialis anterior split transfer to balance that foot.  As far as the Achilles surgery she did well she has good dorsiflexion of the foot now but still mild supination deformity. She also has a component of RSD/complex regional pain syndrome

## 2014-07-07 ENCOUNTER — Ambulatory Visit: Payer: Medicaid Other

## 2014-07-12 ENCOUNTER — Encounter: Payer: Self-pay | Admitting: *Deleted

## 2014-07-12 ENCOUNTER — Ambulatory Visit (INDEPENDENT_AMBULATORY_CARE_PROVIDER_SITE_OTHER): Payer: Medicaid Other | Admitting: *Deleted

## 2014-07-12 DIAGNOSIS — Z3202 Encounter for pregnancy test, result negative: Secondary | ICD-10-CM

## 2014-07-12 DIAGNOSIS — Z3042 Encounter for surveillance of injectable contraceptive: Secondary | ICD-10-CM

## 2014-07-12 LAB — POCT URINE PREGNANCY: Preg Test, Ur: NEGATIVE

## 2014-07-12 MED ORDER — MEDROXYPROGESTERONE ACETATE 150 MG/ML IM SUSP
150.0000 mg | Freq: Once | INTRAMUSCULAR | Status: AC
  Administered 2014-07-12: 150 mg via INTRAMUSCULAR

## 2014-07-12 NOTE — Progress Notes (Signed)
Patient ID: [name], female   DOB: 11/22/1970, 44 y.o.   MRN: [id number] Depo provera 150 mg IM given left ventrogluteal with no complications, negative pregnancy test. Pt to return in 12 weeks for next injection.

## 2014-07-24 ENCOUNTER — Other Ambulatory Visit: Payer: Self-pay | Admitting: Orthopedic Surgery

## 2014-07-24 ENCOUNTER — Other Ambulatory Visit: Payer: Self-pay | Admitting: *Deleted

## 2014-07-24 ENCOUNTER — Telehealth: Payer: Self-pay | Admitting: Orthopedic Surgery

## 2014-07-24 MED ORDER — OXYCODONE HCL 15 MG PO TABS: 15.0000 mg | ORAL_TABLET | ORAL | Status: DC | PRN

## 2014-07-24 MED ORDER — OXYCODONE HCL 15 MG PO TABS: 15.0000 mg | ORAL_TABLET | Freq: Four times a day (QID) | ORAL | Status: DC | PRN

## 2014-07-24 NOTE — Telephone Encounter (Addendum)
Patient is calling requesting a refill on oxyCODONE (ROXICODONE) 15 MG immediate release table please advise?

## 2014-07-24 NOTE — Telephone Encounter (Signed)
Patient picked up Rx

## 2014-08-08 ENCOUNTER — Ambulatory Visit (INDEPENDENT_AMBULATORY_CARE_PROVIDER_SITE_OTHER): Payer: Medicaid Other | Admitting: Orthopedic Surgery

## 2014-08-08 ENCOUNTER — Encounter: Payer: Self-pay | Admitting: Orthopedic Surgery

## 2014-08-08 VITALS — BP 210/126 | Ht 61.0 in | Wt 132.0 lb

## 2014-08-08 DIAGNOSIS — S82852S Displaced trimalleolar fracture of left lower leg, sequela: Secondary | ICD-10-CM | POA: Diagnosis not present

## 2014-08-08 DIAGNOSIS — M24569 Contracture, unspecified knee: Secondary | ICD-10-CM

## 2014-08-08 DIAGNOSIS — G90522 Complex regional pain syndrome I of left lower limb: Secondary | ICD-10-CM

## 2014-08-08 DIAGNOSIS — M2450 Contracture, unspecified joint: Secondary | ICD-10-CM | POA: Diagnosis not present

## 2014-08-08 NOTE — Patient Instructions (Signed)
Foot exercises 100 x day  Capzasin 3 x day to left calf

## 2014-08-08 NOTE — Progress Notes (Signed)
Follow-up visit  Chief Complaint  Patient presents with  . Follow-up    1 month follow up left ankle fracture,    The patient had initial surgery for trimalleolar fracture. She then had syndesmosis screw removal. She also had Z-lengthening of the Achilles tendon. She also had incision and drainage for possible wound infection which was negative  She presents now with RSD, chronic pain, tibialis anterior muscle spasm  She's complaining of calf pain no evidence of DVT  Her tibialis anterior stays in constant spasm  Her Z-lengthening is healed. Her foot is plantigrade with the exception of some mild supination from the anterior tibialis spasm  Recommend active range of motion exercises. I advised her that we have to start to wean her from this oxycodone  I will see her in July 12 she will stay in a Cam Walker she will wean herself with my assistance from this oxycodone. Next prescription will be 10 mg every 6. We advise her to place capsaicin on the calf muscle for discomfort.

## 2014-08-22 ENCOUNTER — Ambulatory Visit (INDEPENDENT_AMBULATORY_CARE_PROVIDER_SITE_OTHER): Payer: Medicaid Other | Admitting: Orthopedic Surgery

## 2014-08-22 VITALS — BP 127/84 | Ht 61.0 in | Wt 132.0 lb

## 2014-08-22 DIAGNOSIS — M24569 Contracture, unspecified knee: Secondary | ICD-10-CM

## 2014-08-22 DIAGNOSIS — M2450 Contracture, unspecified joint: Secondary | ICD-10-CM | POA: Diagnosis not present

## 2014-08-22 DIAGNOSIS — S82851S Displaced trimalleolar fracture of right lower leg, sequela: Secondary | ICD-10-CM

## 2014-08-22 DIAGNOSIS — G90522 Complex regional pain syndrome I of left lower limb: Secondary | ICD-10-CM | POA: Diagnosis not present

## 2014-08-22 MED ORDER — PROMETHAZINE HCL 25 MG PO TABS: 25.0000 mg | ORAL_TABLET | Freq: Four times a day (QID) | ORAL | Status: DC | PRN

## 2014-08-22 MED ORDER — OXYCODONE HCL 15 MG PO TABS: 15.0000 mg | ORAL_TABLET | Freq: Four times a day (QID) | ORAL | Status: DC | PRN

## 2014-08-22 MED ORDER — AMITRIPTYLINE HCL 50 MG PO TABS
50.0000 mg | ORAL_TABLET | Freq: Every day | ORAL | Status: DC
Start: 2014-08-22 — End: 2014-09-25

## 2014-08-22 NOTE — Progress Notes (Signed)
Patient ID: Annice PihMaria L Trexler, female   DOB: 1970/04/23, 44 y.o.   MRN: [id number]   Patient ID: Annice PihMaria L Trexler, female   DOB: 1970/04/23, 44 y.o.   MRN: [id number]  Follow up visit  Chief Complaint  Patient presents with  . Follow-up    2 week follow up left ankle fx, DOS 12/28/13    BP 127/84 mmHg  Ht 5\' 1"  (1.549 m)  Wt 132 lb (59.875 kg)  BMI 24.95 kg/m2  No diagnosis found.  The patient is still having some calf pain the capsaicin made her leg too hot. She has a plantigrade foot but she has not regained any dorsiflexion plantar flexion. She can move her toes she has hyperesthesias on the dorsum of the foot she definitely has RSD or complex regional pain syndrome. We will start amitriptyline 50 mg at night continue her oxycodone 15 mg every 6  Follow-up one month continue Cam Walker walking and exercises as she can do. Physical therapy not available because Medicaid only last 3 visits. This is significantly hindering her postoperative recovery and is basically preventing her from recovery to a functional foot

## 2014-08-22 NOTE — Patient Instructions (Signed)
New meds sent to your pharmacy 

## 2014-09-19 ENCOUNTER — Telehealth: Payer: Self-pay | Admitting: Orthopedic Surgery

## 2014-09-19 NOTE — Telephone Encounter (Signed)
Patient needs to come in Wednesday morning

## 2014-09-19 NOTE — Telephone Encounter (Signed)
Patient just called (09/19/14,4:02pm) and relayed that she has been noticing an area around her left ankle (most recent surgery, hardware removal 12/28/13) that has had some bleeding.  She said it actually has been going on maybe a few weeks.  She said she knew she had an upcoming appointment Monday, 09/25/14).  Please call/advise patient, who lives in [place], and does not drive, regarding scheduling, ph# [phone]

## 2014-09-19 NOTE — Telephone Encounter (Signed)
Called back to patient and scheduled accordingly.

## 2014-09-20 ENCOUNTER — Encounter: Payer: Self-pay | Admitting: Orthopedic Surgery

## 2014-09-20 ENCOUNTER — Ambulatory Visit (INDEPENDENT_AMBULATORY_CARE_PROVIDER_SITE_OTHER): Payer: Medicaid Other | Admitting: Orthopedic Surgery

## 2014-09-20 VITALS — BP 131/95 | Ht 61.0 in | Wt 132.0 lb

## 2014-09-20 DIAGNOSIS — G90522 Complex regional pain syndrome I of left lower limb: Secondary | ICD-10-CM

## 2014-09-20 DIAGNOSIS — S82851S Displaced trimalleolar fracture of right lower leg, sequela: Secondary | ICD-10-CM | POA: Diagnosis not present

## 2014-09-20 DIAGNOSIS — M2450 Contracture, unspecified joint: Secondary | ICD-10-CM | POA: Diagnosis not present

## 2014-09-20 DIAGNOSIS — M24569 Contracture, unspecified knee: Secondary | ICD-10-CM

## 2014-09-20 MED ORDER — OXYCODONE HCL 15 MG PO TABS: 15.0000 mg | ORAL_TABLET | Freq: Four times a day (QID) | ORAL | Status: DC | PRN

## 2014-09-20 NOTE — Progress Notes (Signed)
Patient ID: Townsend Roger, female   DOB: [birth year], 44 y.o.   MRN: [id number] Patient ID: MONTGOMERY ROTHLISBERGER, female   DOB: [birth year], 44 y.o.   MRN: [id number]  Follow up visit  Chief Complaint  Patient presents with  . Wound Check    wound check left ankle    BP 131/95 mmHg  Ht 5' 1"  (1.549 m)  Wt 132 lb (59.875 kg)  BMI 24.95 kg/m2  Encounter Diagnoses  Name Primary?  . Joint contracture of lower leg   . Complex regional pain syndrome I of lower limb, left Yes  . Fracture of ankle, trimalleolar, right, closed, sequela     Appropriate presents several days prior to her scheduled visit on Monday with bleeding from the posterior incision in her Achilles release. One of her sutures is come to the surface we removed it.  After sterile cleansing of the area we used a sterile suture removal kit remove 1 nonabsorbable suture  She will treat this with Neosporin once a day with a bandage change once a day and then follow-up with [place] on Monday  Meds ordered this encounter  Medications  . oxyCODONE (ROXICODONE) 15 MG immediate release tablet    Sig: Take 1 tablet (15 mg total) by mouth every 6 (six) hours as needed for pain.    Dispense:  90 tablet    Refill:  0

## 2014-09-25 ENCOUNTER — Encounter: Payer: Self-pay | Admitting: Orthopedic Surgery

## 2014-09-25 ENCOUNTER — Ambulatory Visit (INDEPENDENT_AMBULATORY_CARE_PROVIDER_SITE_OTHER): Payer: Medicaid Other | Admitting: Orthopedic Surgery

## 2014-09-25 VITALS — BP 125/80 | Ht 61.0 in | Wt 132.0 lb

## 2014-09-25 DIAGNOSIS — S82851S Displaced trimalleolar fracture of right lower leg, sequela: Secondary | ICD-10-CM

## 2014-09-25 DIAGNOSIS — M2450 Contracture, unspecified joint: Secondary | ICD-10-CM | POA: Diagnosis not present

## 2014-09-25 DIAGNOSIS — M24569 Contracture, unspecified knee: Secondary | ICD-10-CM

## 2014-09-25 DIAGNOSIS — G90522 Complex regional pain syndrome I of left lower limb: Secondary | ICD-10-CM

## 2014-09-25 MED ORDER — NORTRIPTYLINE HCL 10 MG PO CAPS
10.0000 mg | ORAL_CAPSULE | Freq: Every day | ORAL | Status: DC
Start: 2014-09-25 — End: 2016-03-07

## 2014-09-25 NOTE — Progress Notes (Signed)
Patient ID: [name], female   DOB: 03/11/1970, 44 y.o.   MRN: [id number]  Follow up visit  Chief Complaint  Patient presents with  . Follow-up    1 month follow up left ankle fracture, DOS 12/28/13    BP 125/80 mmHg  Ht  (1.549 m)  Wt 132 lb (59.875 kg)  BMI 24.95 kg/m2  Encounter Diagnoses  Name Primary?  . Joint contracture of lower leg Yes  . Complex regional pain syndrome I of lower limb, left   . Fracture of ankle, trimalleolar, right, closed, sequela     Willadean was just seen a couple of days ago we removed a non-absorbable suture. That area is cleaned up nicely with Neosporin and sterile bandage  She has continued atrophy and hypersensitivity which is somewhat improved with amitriptyline. The amitriptyline however is making her tired in the morning. We had her on 50 mg at night  Her range of motion now is 5 of dorsiflexion 10 of plantarflexion. She has intact Achilles after lengthening  She is placed in a Cam Walker told to weight-bear as tolerated continue active assisted range of motion and active range of motion  Medicaid will not allow her to have physical therapy which is significantly inhibiting her progress and overall function  Follow-up with me in 4 weeks  Meds ordered this encounter  Medications  . nortriptyline (PAMELOR) 10 MG capsule    Sig: Take 1 capsule (10 mg total) by mouth at bedtime.    Dispense:  28 capsule    Refill:  3

## 2014-10-04 ENCOUNTER — Ambulatory Visit: Payer: Medicaid Other

## 2014-10-06 ENCOUNTER — Encounter: Payer: Self-pay | Admitting: Obstetrics & Gynecology

## 2014-10-06 ENCOUNTER — Ambulatory Visit: Payer: Medicaid Other

## 2014-10-09 ENCOUNTER — Other Ambulatory Visit: Payer: Self-pay | Admitting: *Deleted

## 2014-10-09 ENCOUNTER — Telehealth: Payer: Self-pay | Admitting: Orthopedic Surgery

## 2014-10-09 MED ORDER — OXYCODONE HCL 15 MG PO TABS: 15.0000 mg | ORAL_TABLET | Freq: Four times a day (QID) | ORAL | Status: DC | PRN

## 2014-10-09 NOTE — Telephone Encounter (Signed)
Patient is requesting a refill on medication oxyCODONE (ROXICODONE) 15 MG immediate release tablet please advise?

## 2014-10-09 NOTE — Telephone Encounter (Signed)
Prescription available, called patient, no answer 

## 2014-10-10 ENCOUNTER — Ambulatory Visit (INDEPENDENT_AMBULATORY_CARE_PROVIDER_SITE_OTHER): Payer: Medicaid Other | Admitting: *Deleted

## 2014-10-10 ENCOUNTER — Encounter: Payer: Self-pay | Admitting: *Deleted

## 2014-10-10 DIAGNOSIS — Z3042 Encounter for surveillance of injectable contraceptive: Secondary | ICD-10-CM | POA: Diagnosis not present

## 2014-10-10 DIAGNOSIS — Z3202 Encounter for pregnancy test, result negative: Secondary | ICD-10-CM | POA: Diagnosis not present

## 2014-10-10 LAB — POCT URINE PREGNANCY: PREG TEST UR: NEGATIVE

## 2014-10-10 MED ORDER — MEDROXYPROGESTERONE ACETATE 150 MG/ML IM SUSP
150.0000 mg | Freq: Once | INTRAMUSCULAR | Status: AC
  Administered 2014-10-10: 150 mg via INTRAMUSCULAR

## 2014-10-10 NOTE — Progress Notes (Signed)
Pt here for Depo. Reports no problems at this time. Return in 12 weeks for next shot. JSY 

## 2014-10-10 NOTE — Telephone Encounter (Signed)
Patient picked up Rx

## 2014-10-17 ENCOUNTER — Encounter: Payer: Self-pay | Admitting: Orthopedic Surgery

## 2014-10-17 ENCOUNTER — Ambulatory Visit (INDEPENDENT_AMBULATORY_CARE_PROVIDER_SITE_OTHER): Payer: Medicaid Other | Admitting: Orthopedic Surgery

## 2014-10-17 VITALS — BP 129/89 | Ht 61.0 in | Wt 132.0 lb

## 2014-10-17 DIAGNOSIS — L03116 Cellulitis of left lower limb: Secondary | ICD-10-CM | POA: Diagnosis not present

## 2014-10-17 MED ORDER — CEPHALEXIN 500 MG PO CAPS: 500.0000 mg | ORAL_CAPSULE | Freq: Two times a day (BID) | ORAL | Status: DC

## 2014-10-17 NOTE — Progress Notes (Signed)
Patient ID: [name], female   DOB: [birth year], 44 y.o.   MRN: [id number]  Follow up visit  Chief Complaint  Patient presents with  . Ankle Pain    left ankle pain and swelling and wound check    BP 129/89 mmHg  Ht  (1.549 m)  Wt 132 lb (59.875 kg)  BMI 24.95 kg/m2  Encounter Diagnosis  Name Primary?  . Cellulitis of leg, left Yes    The patient comes in one week earlier than scheduled appointment with a one-week history of posterior ankle pain swelling erythema and increased pain when ambulating. Status post multiple surgeries most recently Achilles tendon lengthening  A few weeks ago she came in with a suture which had made its way to the skin which we removed and she was cleaning this and using a Band-Aid and I think that is the portal for the infection  On today's examination we find that she has erythema without drainage posterior aspect of the left leg with a pinpoint opening where the suture was removed  Review of systems she denies fever chills She is awake alert and oriented 3 mood and affect are normal she is ambulatory with a Cam Walker with an antalgic gait pattern  She has chronic numbness in the top of the foot from her ankle injury and fracture dislocation.  pulses are intact. She can move her toes slightly with extension flexion her foot is plantigrade.Ankle stable.  Recommend Keflex 500 mg twice a day come back in a week for the scheduled appointment and also use a right-handed cane for 7 days.

## 2014-10-17 NOTE — Patient Instructions (Addendum)
Right handed cane  Antibiotic x 7 days sent to pharmacy

## 2014-10-23 ENCOUNTER — Ambulatory Visit (INDEPENDENT_AMBULATORY_CARE_PROVIDER_SITE_OTHER): Payer: Medicaid Other | Admitting: Orthopedic Surgery

## 2014-10-23 ENCOUNTER — Encounter: Payer: Self-pay | Admitting: Orthopedic Surgery

## 2014-10-23 VITALS — BP 130/85 | Ht 61.0 in | Wt 132.0 lb

## 2014-10-23 DIAGNOSIS — G894 Chronic pain syndrome: Secondary | ICD-10-CM

## 2014-10-23 MED ORDER — OXYCODONE HCL 15 MG PO TABS: 15.0000 mg | ORAL_TABLET | Freq: Four times a day (QID) | ORAL | Status: DC | PRN

## 2014-10-23 NOTE — Patient Instructions (Addendum)
Surgery [facility] 9/14- I&D left leg

## 2014-10-23 NOTE — Progress Notes (Signed)
Patient ID: [name], female   DOB: 01-03-1971, 44 y.o.   MRN: [id number]  Follow up visit  Chief Complaint  Patient presents with  . Follow-up    4 week recheck on left ankle, DOS 12-28-13.    BP 130/85 mmHg  Ht  (1.549 m)  Wt 132 lb (59.875 kg)  BMI 24.95 kg/m2  Encounter Diagnosis  Name Primary?  . Chronic pain syndrome, infection left leg  Yes    [name] comes in for follow-up visit as we are treating a cellulitis of her left leg status post multiple ankle surgeries and Achilles tendon lengthening. Several weeks ago she developed a opening of wound from the Achilles tendon surgery and we pulled out a nonabsorbable suture. She did well for a couple of weeks came back in with redness and swelling. We treated with 500 mg of Keflex for twice a day for week and she did not improve  Past Medical History  Diagnosis Date  . Hypertension   . Drug abuse   . Asthma     as child  . Anemia    Past Surgical History  Procedure Laterality Date  . Cholecystectomy    . Orif ankle fracture Left 09/28/2013    Procedure: OPEN REDUCTION INTERNAL FIXATION (ORIF) LEFT ANKLE;  Surgeon: Vickki Hearing, MD;  Location: [facility];  Service: Orthopedics;  Laterality: Left;  . Incision and drainage Left 11/09/2013    Procedure: INCISION AND DRAINAGE LEFT ANKLE WOUND;  Surgeon: Vickki Hearing, MD;  Location: [facility];  Service: Orthopedics;  Laterality: Left;  . Hardware removal Left 12/28/2013    Procedure: HARDWARE REMOVAL LEFT ANKLE;  Surgeon: Vickki Hearing, MD;  Location: [facility];  Service: Orthopedics;  Laterality: Left;  left ankle  . Achilles tendon lengthening Left 12/28/2013    Procedure: ACHILLES TENDON LENGTHENING LEFT;  Surgeon: Vickki Hearing, MD;  Location: [facility];  Service: Orthopedics;  Laterality: Left;  . Cast application Left 12/28/2013    Procedure: SHORT LEG CAST APPLICATION ;  Surgeon: Vickki Hearing, MD;  Location: [facility];  Service: Orthopedics;  Laterality:  Left;   Social History  Substance Use Topics  . Smoking status: Current Every Day Smoker -- 1.00 packs/day for 17 years    Types: Cigarettes  . Smokeless tobacco: Never Used  . Alcohol Use: No   Review of systems patient has no acute change in her review of systems.  BP 130/85 mmHg  Ht  (1.549 m)  Wt 132 lb (59.875 kg)  BMI 24.95 kg/m2 Gen. appearance well-developed well-nourished body habitus normal. Grooming hygiene normal. Oriented 3. Mood normal. She is ambulatory with a cam walking device on the left leg. She has a reddened tender area with no drainage but no improvement after [employer]. Ankle range of motion has minimal active, we do note passive range of motion 20 total at the ankle joint. She has atrophy. She has hypersensitivity of the skin. Incisions are healed. The incisions over the ankle fracture of healed. But the area in question has surrounding erythema and tenderness. Pulses are intact  Cellulitis left leg  Recommend incision and drainage left leg. Patient agrees to surgery.

## 2014-10-24 ENCOUNTER — Encounter (HOSPITAL_COMMUNITY): Payer: Self-pay

## 2014-10-24 ENCOUNTER — Encounter (HOSPITAL_COMMUNITY)
Admission: RE | Admit: 2014-10-24 | Discharge: 2014-10-24 | Disposition: A | Discharge status: Home / Self Care | Payer: Medicaid Other | Source: Ambulatory Visit | Attending: Orthopedic Surgery | Admitting: Orthopedic Surgery

## 2014-10-24 ENCOUNTER — Telehealth: Payer: Self-pay | Admitting: Orthopedic Surgery

## 2014-10-24 ENCOUNTER — Other Ambulatory Visit: Payer: Self-pay

## 2014-10-24 ENCOUNTER — Other Ambulatory Visit: Payer: Self-pay | Admitting: *Deleted

## 2014-10-24 DIAGNOSIS — Z01818 Encounter for other preprocedural examination: Secondary | ICD-10-CM | POA: Diagnosis present

## 2014-10-24 LAB — CBC WITH DIFFERENTIAL/PLATELET
BASOS ABS: 0.1 10*3/uL (ref 0.0–0.1)
Basophils Relative: 1 % (ref 0–1)
EOS ABS: 0.3 10*3/uL (ref 0.0–0.7)
EOS PCT: 2 % (ref 0–5)
HCT: 44.6 % (ref 36.0–46.0)
Hemoglobin: 15.1 g/dL — ABNORMAL HIGH (ref 12.0–15.0)
LYMPHS PCT: 35 % (ref 12–46)
Lymphs Abs: 4.7 10*3/uL — ABNORMAL HIGH (ref 0.7–4.0)
MCH: 30.3 pg (ref 26.0–34.0)
MCHC: 33.9 g/dL (ref 30.0–36.0)
MCV: 89.6 fL (ref 78.0–100.0)
MONO ABS: 1.2 10*3/uL — AB (ref 0.1–1.0)
Monocytes Relative: 9 % (ref 3–12)
Neutro Abs: 7.1 10*3/uL (ref 1.7–7.7)
Neutrophils Relative %: 53 % (ref 43–77)
PLATELETS: 445 10*3/uL — AB (ref 150–400)
RBC: 4.98 MIL/uL (ref 3.87–5.11)
RDW: 14.9 % (ref 11.5–15.5)
WBC: 13.3 10*3/uL — AB (ref 4.0–10.5)

## 2014-10-24 LAB — BASIC METABOLIC PANEL
Anion gap: 9 (ref 5–15)
BUN: 9 mg/dL (ref 6–20)
CALCIUM: 9.2 mg/dL (ref 8.9–10.3)
CHLORIDE: 106 mmol/L (ref 101–111)
CO2: 24 mmol/L (ref 22–32)
CREATININE: 0.85 mg/dL (ref 0.44–1.00)
GFR calc Af Amer: >60 mL/min (ref >60)
GFR calc non Af Amer: >60 mL/min (ref >60)
GLUCOSE: 71 mg/dL (ref 65–99)
Potassium: 2.9 mmol/L — ABNORMAL LOW (ref 3.5–5.1)
Sodium: 139 mmol/L (ref 135–145)

## 2014-10-24 LAB — HCG, SERUM, QUALITATIVE: Preg, Serum: NEGATIVE

## 2014-10-24 MED ORDER — POTASSIUM CHLORIDE CRYS ER 20 MEQ PO TBCR
EXTENDED_RELEASE_TABLET | ORAL | Status: DC
Start: 2014-10-24 — End: 2014-11-20

## 2014-10-24 NOTE — Telephone Encounter (Signed)
[facility] called back, [name] regarding whether 3rd dosage is 12AM or 12PM; ph# [phone]. Routing note to nurse.

## 2014-10-24 NOTE — Telephone Encounter (Addendum)
Call received from [place] at [facility] to report that patient's potassium level is 2.9; states Dr. Jayme Cloud reviewed, and recommends supplementing.  Also relays that WBC is 13.3, and that "Dr. Jayme Cloud did not find this to be a concern."  -- [facility] [place]  Please advise.  [facility]bara[facility] direct Ph# [phone]

## 2014-10-24 NOTE — Patient Instructions (Signed)
[name]  10/24/2014     @PREFPERIOPPHARMACY @   Your procedure is scheduled on  10/25/2014  Report to [name] at  1030  A.M.  Call this number if you have problems the morning of surgery:  [phone]   Remember:  Do not eat food or drink liquids after midnight.  Take these medicines the morning of surgery with A SIP OF WATER  Amlodipine, pamelor, oxycodone, phenergan.   Do not wear jewelry, make-up or nail polish.  Do not wear lotions, powders, or perfumes.  You may wear deodorant.  Do not shave 48 hours prior to surgery.  Men may shave face and neck.  Do not bring valuables to the hospital.  [facility] is not responsible for any belongings or valuables.  Contacts, dentures or bridgework may not be worn into surgery.  Leave your suitcase in the car.  After surgery it may be brought to your room.  For patients admitted to the hospital, discharge time will be determined by your treatment team.  Patients discharged the day of surgery will not be allowed to drive home.   Name and phone number of your driver:   family Special instructions:  none  Please read over the following fact sheets that you were given. Pain Booklet, Coughing and Deep Breathing, Surgical Site Infection Prevention, Anesthesia Post-op Instructions and Care and Recovery After Surgery      Incision and Drainage Incision and drainage is a procedure in which a sac-like structure (cystic structure) is opened and drained. The area to be drained usually contains material such as pus, fluid, or blood.  LET YOUR CAREGIVER KNOW ABOUT:   Allergies to medicine.  Medicines taken, including vitamins, herbs, eyedrops, over-the-counter medicines, and creams.  Use of steroids (by mouth or creams).  Previous problems with anesthetics or numbing medicines.  History of bleeding problems or blood clots.  Previous surgery.  Other health problems, including diabetes and kidney  problems.  Possibility of pregnancy, if this applies. RISKS AND COMPLICATIONS  Pain.  Bleeding.  Scarring.  Infection. BEFORE THE PROCEDURE  You may need to have an ultrasound or other imaging tests to see how large or deep your cystic structure is. Blood tests may also be used to determine if you have an infection or how severe the infection is. You may need to have a tetanus shot. PROCEDURE  The affected area is cleaned with a cleaning fluid. The cyst area will then be numbed with a medicine (local anesthetic). A small incision will be made in the cystic structure. A syringe or catheter may be used to drain the contents of the cystic structure, or the contents may be squeezed out. The area will then be flushed with a cleansing solution. After cleansing the area, it is often gently packed with a gauze or another wound dressing. Once it is packed, it will be covered with gauze and tape or some other type of wound dressing. AFTER THE PROCEDURE   Often, you will be allowed to go home right after the procedure.  You may be given antibiotic medicine to prevent or heal an infection.  If the area was packed with gauze or some other wound dressing, you will likely need to come back in 1 to 2 days to get it removed.  The area should heal in about 14 days. Document Released: 07/23/2000 Document Revised: 07/29/2011 Document Reviewed: 03/24/2011 [facility] Patient Information 2015 [place], [place]. This information is not intended to  replace advice given to you by your health care provider. Make sure you discuss any questions you have with your health care provider. PATIENT INSTRUCTIONS POST-ANESTHESIA  IMMEDIATELY FOLLOWING SURGERY:  Do not drive or operate machinery for the first twenty four hours after surgery.  Do not make any important decisions for twenty four hours after surgery or while taking narcotic pain medications or sedatives.  If you develop intractable nausea and vomiting or a severe  headache please notify your doctor immediately.  FOLLOW-UP:  Please make an appointment with your surgeon as instructed. You do not need to follow up with anesthesia unless specifically instructed to do so.  WOUND CARE INSTRUCTIONS (if applicable):  Keep a dry clean dressing on the anesthesia/puncture wound site if there is drainage.  Once the wound has quit draining you may leave it open to air.  Generally you should leave the bandage intact for twenty four hours unless there is drainage.  If the epidural site drains for more than 36-48 hours please call the anesthesia department.  QUESTIONS?:  Please feel free to call your physician or the hospital operator if you have any questions, and they will be happy to assist you.

## 2014-10-24 NOTE — Pre-Procedure Instructions (Signed)
Patient given information to sign up for my chart at home. 

## 2014-10-24 NOTE — Telephone Encounter (Signed)
Medication sent to pharmacy per dr Romeo Apple verbal order, called patient, no answer, left vm

## 2014-10-24 NOTE — OR Nursing (Signed)
Dr. Jayme Cloud  Notified of labs , potassium 2.9. Orders are to inform Dr. Romeo Apple so she can get supplemented. Dr. Mort Sawyers office called and spoke with Enid Derry who is  giving information to Dr. Mort Sawyers nurse so he can [facility] [place].

## 2014-10-24 NOTE — Telephone Encounter (Signed)
Spoke with pharmacy and patient.

## 2014-10-25 ENCOUNTER — Ambulatory Visit (HOSPITAL_COMMUNITY): Payer: Medicaid Other | Admitting: Anesthesiology

## 2014-10-25 ENCOUNTER — Encounter (HOSPITAL_COMMUNITY)
Admission: RE | Disposition: A | Discharge status: Home / Self Care | Payer: Self-pay | Source: Ambulatory Visit | Attending: Orthopedic Surgery

## 2014-10-25 ENCOUNTER — Ambulatory Visit (HOSPITAL_COMMUNITY)
Admission: RE | Admit: 2014-10-25 | Discharge: 2014-10-25 | Disposition: A | Discharge status: Home / Self Care | Payer: Medicaid Other | Source: Ambulatory Visit | Attending: Orthopedic Surgery | Admitting: Orthopedic Surgery

## 2014-10-25 ENCOUNTER — Encounter (HOSPITAL_COMMUNITY): Payer: Self-pay | Admitting: *Deleted

## 2014-10-25 DIAGNOSIS — I10 Essential (primary) hypertension: Secondary | ICD-10-CM | POA: Diagnosis not present

## 2014-10-25 DIAGNOSIS — F17210 Nicotine dependence, cigarettes, uncomplicated: Secondary | ICD-10-CM | POA: Diagnosis not present

## 2014-10-25 DIAGNOSIS — M795 Residual foreign body in soft tissue: Secondary | ICD-10-CM | POA: Diagnosis not present

## 2014-10-25 DIAGNOSIS — Z79899 Other long term (current) drug therapy: Secondary | ICD-10-CM | POA: Diagnosis not present

## 2014-10-25 DIAGNOSIS — T814XXD Infection following a procedure, subsequent encounter: Secondary | ICD-10-CM | POA: Diagnosis present

## 2014-10-25 DIAGNOSIS — L02416 Cutaneous abscess of left lower limb: Secondary | ICD-10-CM | POA: Diagnosis not present

## 2014-10-25 DIAGNOSIS — G894 Chronic pain syndrome: Secondary | ICD-10-CM

## 2014-10-25 HISTORY — PX: INCISION AND DRAINAGE ABSCESS: SHX5864

## 2014-10-25 LAB — POCT I-STAT 4, (NA,K, GLUC, HGB,HCT)
GLUCOSE: 107 mg/dL — AB (ref 65–99)
HCT: 46 % (ref 36.0–46.0)
Hemoglobin: 15.6 g/dL — ABNORMAL HIGH (ref 12.0–15.0)
Potassium: 3.7 mmol/L (ref 3.5–5.1)
Sodium: 145 mmol/L (ref 135–145)

## 2014-10-25 SURGERY — INCISION AND DRAINAGE, ABSCESS
Anesthesia: General | Site: Leg Lower | Laterality: Left

## 2014-10-25 MED ORDER — FENTANYL CITRATE (PF) 100 MCG/2ML IJ SOLN
25.0000 ug | INTRAMUSCULAR | Status: AC
  Administered 2014-10-25 (×2): 25 ug via INTRAVENOUS

## 2014-10-25 MED ORDER — CHLORHEXIDINE GLUCONATE 4 % EX LIQD: 60.0000 mL | Freq: Once | CUTANEOUS | Status: DC

## 2014-10-25 MED ORDER — OXYCODONE HCL 15 MG PO TABS: 15.0000 mg | ORAL_TABLET | ORAL | Status: DC

## 2014-10-25 MED ORDER — ONDANSETRON HCL 4 MG/2ML IJ SOLN
4.0000 mg | Freq: Once | INTRAMUSCULAR | Status: AC
  Administered 2014-10-25: 4 mg via INTRAVENOUS

## 2014-10-25 MED ORDER — ONDANSETRON HCL 4 MG/2ML IJ SOLN
INTRAMUSCULAR | Status: AC
  Filled 2014-10-25: qty 2

## 2014-10-25 MED ORDER — MIDAZOLAM HCL 2 MG/2ML IJ SOLN
INTRAMUSCULAR | Status: AC
  Filled 2014-10-25: qty 2

## 2014-10-25 MED ORDER — PROPOFOL 10 MG/ML IV BOLUS
INTRAVENOUS | Status: AC
  Filled 2014-10-25: qty 20

## 2014-10-25 MED ORDER — MIDAZOLAM HCL 2 MG/2ML IJ SOLN
1.0000 mg | INTRAMUSCULAR | Status: DC | PRN
  Administered 2014-10-25: 2 mg via INTRAVENOUS

## 2014-10-25 MED ORDER — CEFAZOLIN SODIUM-DEXTROSE 2-3 GM-% IV SOLR
2.0000 g | INTRAVENOUS | Status: AC
  Administered 2014-10-25: 2 g via INTRAVENOUS

## 2014-10-25 MED ORDER — PROPOFOL 10 MG/ML IV BOLUS
INTRAVENOUS | Status: DC | PRN
  Administered 2014-10-25: 150 mg via INTRAVENOUS
  Administered 2014-10-25: 50 mg via INTRAVENOUS

## 2014-10-25 MED ORDER — EPHEDRINE SULFATE 50 MG/ML IJ SOLN
INTRAMUSCULAR | Status: AC
  Filled 2014-10-25: qty 1

## 2014-10-25 MED ORDER — LACTATED RINGERS IV SOLN
INTRAVENOUS | Status: DC
  Administered 2014-10-25: 12:00:00 via INTRAVENOUS

## 2014-10-25 MED ORDER — SODIUM CHLORIDE 0.9 % IN NEBU
INHALATION_SOLUTION | RESPIRATORY_TRACT | Status: AC
  Filled 2014-10-25: qty 3

## 2014-10-25 MED ORDER — FENTANYL CITRATE (PF) 250 MCG/5ML IJ SOLN
INTRAMUSCULAR | Status: DC | PRN
  Administered 2014-10-25 (×9): 50 ug via INTRAVENOUS

## 2014-10-25 MED ORDER — SODIUM CHLORIDE 0.9 % IR SOLN
Status: DC | PRN
  Administered 2014-10-25: 1000 mL

## 2014-10-25 MED ORDER — FENTANYL CITRATE (PF) 250 MCG/5ML IJ SOLN
INTRAMUSCULAR | Status: AC
  Filled 2014-10-25: qty 25

## 2014-10-25 MED ORDER — ALBUTEROL SULFATE (2.5 MG/3ML) 0.083% IN NEBU
INHALATION_SOLUTION | RESPIRATORY_TRACT | Status: AC
  Filled 2014-10-25: qty 3

## 2014-10-25 MED ORDER — FENTANYL CITRATE (PF) 100 MCG/2ML IJ SOLN
25.0000 ug | INTRAMUSCULAR | Status: DC | PRN
  Administered 2014-10-25 (×3): 25 ug via INTRAVENOUS
  Filled 2014-10-25: qty 2

## 2014-10-25 MED ORDER — ONDANSETRON HCL 4 MG/2ML IJ SOLN: 4.0000 mg | Freq: Once | INTRAMUSCULAR | Status: DC | PRN

## 2014-10-25 MED ORDER — BUPIVACAINE-EPINEPHRINE 0.5% -1:200000 IJ SOLN
INTRAMUSCULAR | Status: DC | PRN
  Administered 2014-10-25: 30 mL

## 2014-10-25 MED ORDER — ALBUTEROL SULFATE (2.5 MG/3ML) 0.083% IN NEBU
2.5000 mg | INHALATION_SOLUTION | Freq: Once | RESPIRATORY_TRACT | Status: AC
  Administered 2014-10-25: 2.5 mg via RESPIRATORY_TRACT

## 2014-10-25 MED ORDER — FENTANYL CITRATE (PF) 100 MCG/2ML IJ SOLN
INTRAMUSCULAR | Status: AC
  Filled 2014-10-25: qty 4

## 2014-10-25 MED ORDER — BUPIVACAINE-EPINEPHRINE (PF) 0.5% -1:200000 IJ SOLN
INTRAMUSCULAR | Status: AC
  Filled 2014-10-25: qty 30

## 2014-10-25 MED ORDER — SODIUM CHLORIDE 0.9 % IJ SOLN
INTRAMUSCULAR | Status: AC
  Filled 2014-10-25: qty 10

## 2014-10-25 MED ORDER — CEFAZOLIN SODIUM-DEXTROSE 2-3 GM-% IV SOLR
INTRAVENOUS | Status: AC
  Filled 2014-10-25: qty 50

## 2014-10-25 MED ORDER — FENTANYL CITRATE (PF) 100 MCG/2ML IJ SOLN
INTRAMUSCULAR | Status: AC
  Filled 2014-10-25: qty 2

## 2014-10-25 SURGICAL SUPPLY — 37 items
BAG HAMPER (MISCELLANEOUS) ×3 IMPLANT
BANDAGE COBAN STERILE 2 (GAUZE/BANDAGES/DRESSINGS) IMPLANT
BANDAGE ELASTIC 4 VELCRO NS (GAUZE/BANDAGES/DRESSINGS) ×6 IMPLANT
BANDAGE ESMARK 4X12 BL STRL LF (DISPOSABLE) ×1 IMPLANT
BNDG CONFORM 2 STRL LF (GAUZE/BANDAGES/DRESSINGS) IMPLANT
BNDG ESMARK 4X12 BLUE STRL LF (DISPOSABLE) ×3
CLOTH BEACON ORANGE TIMEOUT ST (SAFETY) ×3 IMPLANT
COVER LIGHT HANDLE STERIS (MISCELLANEOUS) ×6 IMPLANT
DRAPE ORTHO 2.5IN SPLIT 77X108 (DRAPES) IMPLANT
DRAPE ORTHO SPLIT 77X108 STRL (DRAPES)
ELECT REM PT RETURN 9FT ADLT (ELECTROSURGICAL) ×3
ELECTRODE REM PT RTRN 9FT ADLT (ELECTROSURGICAL) ×1 IMPLANT
GAUZE SPONGE 4X4 12PLY STRL (GAUZE/BANDAGES/DRESSINGS) ×2 IMPLANT
GAUZE XEROFORM 5X9 LF (GAUZE/BANDAGES/DRESSINGS) ×3 IMPLANT
GLOVE OPTIFIT SS 8.0 STRL (GLOVE) ×3 IMPLANT
GLOVE SKINSENSE NS SZ8.0 LF (GLOVE) ×2
GLOVE SKINSENSE STRL SZ8.0 LF (GLOVE) ×1 IMPLANT
GOWN STRL REUS W/TWL LRG LVL3 (GOWN DISPOSABLE) ×12 IMPLANT
GOWN STRL REUS W/TWL XL LVL3 (GOWN DISPOSABLE) ×3 IMPLANT
INST SET MINOR BONE (KITS) ×3 IMPLANT
KIT ROOM TURNOVER APOR (KITS) ×3 IMPLANT
MANIFOLD NEPTUNE II (INSTRUMENTS) ×3 IMPLANT
MARKER SKIN DUAL TIP RULER LAB (MISCELLANEOUS) ×3 IMPLANT
NEEDLE HYPO 21X1.5 SAFETY (NEEDLE) ×3 IMPLANT
NEEDLE HYPO 25X1 1.5 SAFETY (NEEDLE) ×3 IMPLANT
NS IRRIG 1000ML POUR BTL (IV SOLUTION) ×3 IMPLANT
PACK BASIC LIMB (CUSTOM PROCEDURE TRAY) ×3 IMPLANT
PACK MINOR (CUSTOM PROCEDURE TRAY) IMPLANT
PAD ABD 5X9 TENDERSORB (GAUZE/BANDAGES/DRESSINGS) ×6 IMPLANT
PAD ARMBOARD 7.5X6 YLW CONV (MISCELLANEOUS) ×3 IMPLANT
SET BASIN LINEN APH (SET/KITS/TRAYS/PACK) ×3 IMPLANT
SPONGE GAUZE 4X4 12PLY (GAUZE/BANDAGES/DRESSINGS) ×3 IMPLANT
SUT ETHILON 3 0 FSL (SUTURE) ×3 IMPLANT
SWAB CULTURE LIQ STUART DBL (MISCELLANEOUS) ×3 IMPLANT
SYR 30ML LL (SYRINGE) ×3 IMPLANT
SYR BULB IRRIGATION 50ML (SYRINGE) ×3 IMPLANT
TUBE ANAEROBIC PORT A CUL  W/M (MISCELLANEOUS) ×3 IMPLANT

## 2014-10-25 NOTE — Anesthesia Postprocedure Evaluation (Signed)
  Anesthesia Post-op Note  Patient: [name]  Procedure(s) Performed: Procedure(s): INCISION AND DRAINAGE LEFT LOWER LEG ABSCESS (Left)  Patient Location: PACU  Anesthesia Type:General  Level of Consciousness: awake, alert  and oriented  Airway and Oxygen Therapy: Patient Spontanous Breathing and Patient connected to nasal cannula oxygen  Post-op Pain: mild  Post-op Assessment: Post-op Vital signs reviewed, Patient's Cardiovascular Status Stable, Respiratory Function Stable, Patent Airway and No signs of Nausea or vomiting              Post-op Vital Signs: Reviewed and stable  Last Vitals:  Filed Vitals:   10/25/14 1206  BP: 132/89  Resp:     Complications: No apparent anesthesia complications

## 2014-10-25 NOTE — Anesthesia Preprocedure Evaluation (Signed)
Anesthesia Evaluation  Patient identified by MRN, date of birth, ID band Patient awake    Reviewed: Allergy & Precautions, H&P , NPO status , Patient's Chart, lab work & pertinent test results  Airway Mallampati: III TM Distance: >3 FB     Dental  (+) Teeth Intact   Pulmonary asthma , Current Smoker,  breath sounds clear to auscultation        Cardiovascular hypertension, Pt. on medications Rhythm:Regular Rate:Normal     Neuro/Psych    GI/Hepatic negative GI ROS,   Endo/Other    Renal/GU      Musculoskeletal   Abdominal   Peds  Hematology   Anesthesia Other Findings   Reproductive/Obstetrics                           Anesthesia Physical Anesthesia Plan  ASA: II  Anesthesia Plan: General   Post-op Pain Management:    Induction: Intravenous  Airway Management Planned: LMA  Additional Equipment:   Intra-op Plan:   Post-operative Plan: Extubation in OR  Informed Consent: I have reviewed the patients History and Physical, chart, labs and discussed the procedure including the risks, benefits and alternatives for the proposed anesthesia with the patient or authorized representative who has indicated his/her understanding and acceptance.     Plan Discussed with:   Anesthesia Plan Comments:         Anesthesia Quick Evaluation  

## 2014-10-25 NOTE — Op Note (Signed)
10/25/2014  1:11 PM  PATIENT:  [name]  44 y.o. female  PRE-OPERATIVE DIAGNOSIS:  LEFT LOWER LEG INFECTION  POST-OPERATIVE DIAGNOSIS:  LEFT LOWER LEG INFECTION  PROCEDURE:  Procedure(s): INCISION AND DRAINAGE LEFT LOWER LEG  STITCH ABSCESS (Left)  Surgical findings. The patient had extensive adhesions of the Achilles tendon. The nonabsorbable sutures were adhered to the skin and breaking through the skin at the site of saline synovitis. There was no abscess found in terms of urinary drainage.  Details of procedure site marking and chart update were performed in the preop area. She was taken to surgery and had general anesthesia. She was placed in lateral decubitus position on a beanbag right side down. After sterile prep and drape and timeout were completed the tourniquet was elevated. 3 mmHg.  The stitch was excised from the skin and the skin edges were freshened up. Finger dissection was performed to release the Achilles from the underlying skin. We also removed 2 other stitches that seemed to be adherent to the skin but not breaking through  Cultures were taken and thorough irrigation was performed. Several nylon sutures were placed and the area of the stitch abscess that broke to the skin was left open and packed with a wet-to-dry normal saline gauze dressing  The leg was dressed and tourniquet was released  Patient was extubated and taken to recovery room in stable condition   SURGEON:  Surgeon(s) and Role:    * Vickki Hearing, MD - Primary  PHYSICIAN ASSISTANT:   ASSISTANTS: none   ANESTHESIA:   general  EBL:  Total I/O In: 700 [I.V.:700] Out: -   BLOOD ADMINISTERED:none  DRAINS: none   LOCAL MEDICATIONS USED:  MARCAINE     SPECIMEN:  No Specimen  DISPOSITION OF SPECIMEN:  CULTURES SENT FOR MICROBIOLOGY  COUNTS:  YES  TOURNIQUET:   Total Tourniquet Time Documented: Thigh (Left) - 16 minutes Total: Thigh (Left) - 16 minutes   DICTATION: .Reubin Milan  Dictation  PLAN OF CARE: Discharge to home after PACU  PATIENT DISPOSITION:  PACU - hemodynamically stable.   Delay start of Pharmacological VTE agent (>24hrs) due to surgical blood loss or risk of bleeding: not applicable

## 2014-10-25 NOTE — Transfer of Care (Signed)
Immediate Anesthesia Transfer of Care Note  Patient: [name]  Procedure(s) Performed: Procedure(s): INCISION AND DRAINAGE LEFT LOWER LEG ABSCESS (Left)  Patient Location: PACU  Anesthesia Type:General  Level of Consciousness: awake, alert  and oriented  Airway & Oxygen Therapy: Patient Spontanous Breathing  Post-op Assessment: Report given to RN  Post vital signs: Reviewed and stable  Last Vitals:  Filed Vitals:   10/25/14 1206  BP: 132/89  Resp:     Complications: No apparent anesthesia complications
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
## 2014-10-25 NOTE — Addendum Note (Signed)
Addendum  created 10/25/14 1349 by Moshe Salisbury, CRNA   Modules edited: Charges VN

## 2014-10-25 NOTE — Brief Op Note (Signed)
10/25/2014  1:11 PM  PATIENT:  [name]  44 y.o. female  PRE-OPERATIVE DIAGNOSIS:  LEFT LOWER LEG INFECTION  POST-OPERATIVE DIAGNOSIS:  LEFT LOWER LEG INFECTION  PROCEDURE:  Procedure(s): INCISION AND DRAINAGE LEFT LOWER LEG  STITCH ABSCESS (Left)  Surgical findings. The patient had extensive adhesions of the Achilles tendon. The nonabsorbable sutures were adhered to the skin and breaking through the skin at the site of saline synovitis. There was no abscess found in terms of PURULENT drainage.  Details of procedure site marking and chart update were performed in the preop area. She was taken to surgery and had general anesthesia. She was placed in lateral decubitus position on a beanbag right side down. After sterile prep and drape and timeout were completed the tourniquet was elevated. 3 mmHg.  The stitch was excised from the skin and the skin edges were freshened up. Finger dissection was performed to release the Achilles from the underlying skin. We also removed 2 other stitches that seemed to be adherent to the skin but not breaking through  Cultures were taken and thorough irrigation was performed. Several nylon sutures were placed and the area of the stitch abscess that broke to the skin was left open and packed with a wet-to-dry normal saline gauze dressing  The leg was dressed and tourniquet was released  Patient was extubated and taken to recovery room in stable condition   SURGEON:  Surgeon(s) and Role:    * Vickki Hearing, MD - Primary  PHYSICIAN ASSISTANT:   ASSISTANTS: none   ANESTHESIA:   general  EBL:  Total I/O In: 700 [I.V.:700] Out: -   BLOOD ADMINISTERED:none  DRAINS: none   LOCAL MEDICATIONS USED:  MARCAINE     SPECIMEN:  No Specimen  DISPOSITION OF SPECIMEN:  CULTURES SENT FOR MICROBIOLOGY  COUNTS:  YES  TOURNIQUET:   Total Tourniquet Time Documented: Thigh (Left) - 16 minutes Total: Thigh (Left) - 16 minutes   DICTATION: .Reubin Milan  Dictation  PLAN OF CARE: Discharge to home after PACU  PATIENT DISPOSITION:  PACU - hemodynamically stable.   Delay start of Pharmacological VTE agent (>24hrs) due to surgical blood loss or risk of bleeding: not applicable

## 2014-10-25 NOTE — Interval H&P Note (Signed)
History and Physical Interval Note:  10/25/2014 11:56 AM  [name]  has presented today for surgery, with the diagnosis of LEFT LOWER LEG INFECTION  The various methods of treatment have been discussed with the patient and family. After consideration of risks, benefits and other options for treatment, the patient has consented to  Procedure(s) with comments: INCISION AND DRAINAGE ABSCESS (Left) - LEFT LOWER LEG - dr. requests time 12:00 as a surgical intervention .  The patient's history has been reviewed, patient examined, no change in status, stable for surgery.  I have reviewed the patient's chart and labs.  Questions were answered to the patient's satisfaction.     Fuller Canada

## 2014-10-25 NOTE — Progress Notes (Signed)
Preoperative laboratory studies indicated potassium was 2.9 she tends to run low. However that is too low so we put her on potassium pills orally and she will need a recheck prior to surgery.

## 2014-10-25 NOTE — Anesthesia Procedure Notes (Signed)
Procedure Name: LMA Insertion Date/Time: 10/25/2014 12:23 PM Performed by: Glynn Octave E Pre-anesthesia Checklist: Patient identified, Patient being monitored, Emergency Drugs available, Timeout performed and Suction available Patient Re-evaluated:Patient Re-evaluated prior to inductionOxygen Delivery Method: Circle System Utilized Preoxygenation: Pre-oxygenation with 100% oxygen Intubation Type: IV induction Ventilation: Mask ventilation without difficulty LMA: LMA inserted LMA Size: 3.0 Number of attempts: 1 Placement Confirmation: positive ETCO2 and breath sounds checked- equal and bilateral

## 2014-10-26 ENCOUNTER — Encounter (HOSPITAL_COMMUNITY): Payer: Self-pay | Admitting: Orthopedic Surgery

## 2014-10-29 LAB — CULTURE, ROUTINE-ABSCESS: Culture: NO GROWTH

## 2014-10-30 ENCOUNTER — Ambulatory Visit (INDEPENDENT_AMBULATORY_CARE_PROVIDER_SITE_OTHER): Payer: Medicaid Other | Admitting: Orthopedic Surgery

## 2014-10-30 VITALS — BP 138/83 | Ht 61.0 in | Wt 145.0 lb

## 2014-10-30 DIAGNOSIS — Z4789 Encounter for other orthopedic aftercare: Secondary | ICD-10-CM

## 2014-10-30 LAB — ANAEROBIC CULTURE

## 2014-10-30 NOTE — Progress Notes (Signed)
Patient ID: [name], female   DOB: 1970/04/15, 44 y.o.   MRN: [id number]  Follow up visit  Chief Complaint  Patient presents with  . Follow-up    Post op #1, incision and drainage of left ankle, DOS 10-25-14.    BP 138/83 mmHg  Ht  (1.549 m)  Wt 145 lb (65.772 kg)  BMI 27.41 kg/m2  Aftercare musculoskeletal system .  Postop visit status post incision and drainage left leg  Patient had stitch abscess culture negative  Recommend dressing change today and on Thursday. Today the wound looks clean the suture line is intact there is no drainage/ purulence

## 2014-11-01 ENCOUNTER — Telehealth: Payer: Self-pay | Admitting: Orthopedic Surgery

## 2014-11-01 NOTE — Telephone Encounter (Signed)
Patient states she is having redness and splotches; said not sure if it is the most recently prescribed medication/anti-biotic?  States [facility] [place]. Patient has a scheduled appointment tomorrow, 11/02/14; please advise patient.  Ph# [phone]

## 2014-11-02 ENCOUNTER — Ambulatory Visit (INDEPENDENT_AMBULATORY_CARE_PROVIDER_SITE_OTHER): Payer: Medicaid Other | Admitting: Orthopedic Surgery

## 2014-11-02 VITALS — BP 131/87 | Ht 61.0 in | Wt 145.0 lb

## 2014-11-02 DIAGNOSIS — Z4789 Encounter for other orthopedic aftercare: Secondary | ICD-10-CM

## 2014-11-02 NOTE — Telephone Encounter (Signed)
Routing to Dr Harrison 

## 2014-11-02 NOTE — Progress Notes (Signed)
Patient ID: [name], female   DOB: [birth year], 44 y.o.   MRN: [id number]  Follow up visit  Chief Complaint  Patient presents with  . Follow-up    dressing change left ankle, I&D, DOS 10/25/14    BP 131/87 mmHg  Ht  (1.549 m)  Wt 145 lb (65.772 kg)  BMI 27.41 kg/m2  No diagnosis found.  . Wound looks clean wet-to-dry follow-up in a week  Went to ER question delayed reaction to Keflex patient got a rash medication stop cultures negative  See her next week

## 2014-11-03 NOTE — Telephone Encounter (Signed)
Discussed at office visit.

## 2014-11-06 ENCOUNTER — Ambulatory Visit: Payer: Medicaid Other | Admitting: Orthopedic Surgery

## 2014-11-09 ENCOUNTER — Ambulatory Visit (INDEPENDENT_AMBULATORY_CARE_PROVIDER_SITE_OTHER): Payer: Medicaid Other | Admitting: Orthopedic Surgery

## 2014-11-09 VITALS — BP 131/95 | Ht 61.0 in | Wt 145.0 lb

## 2014-11-09 DIAGNOSIS — L03116 Cellulitis of left lower limb: Secondary | ICD-10-CM

## 2014-11-09 DIAGNOSIS — Z4789 Encounter for other orthopedic aftercare: Secondary | ICD-10-CM

## 2014-11-09 NOTE — Progress Notes (Signed)
Patient ID: [name], female   DOB: 06/07/1970, 44 y.o.   MRN: [id number]  Follow up visit  Chief Complaint  Patient presents with  . Follow-up    Dressing change of left ankle,post op, DOS 10-25-14.    BP 131/95 mmHg  Ht  (1.549 m)  Wt 145 lb (65.772 kg)  BMI 27.41 kg/m2    Posterior left leg wound clean dry without erythema   Return Monday for new wet to dry

## 2014-11-13 ENCOUNTER — Ambulatory Visit (INDEPENDENT_AMBULATORY_CARE_PROVIDER_SITE_OTHER): Payer: Medicaid Other | Admitting: Orthopedic Surgery

## 2014-11-13 VITALS — BP 126/86 | Ht 61.0 in | Wt 145.0 lb

## 2014-11-13 DIAGNOSIS — Z4789 Encounter for other orthopedic aftercare: Secondary | ICD-10-CM

## 2014-11-13 NOTE — Progress Notes (Signed)
Patient ID: [name], female   DOB: Dec 08, 1970, 44 y.o.   MRN: [id number]  Follow up visit  Chief Complaint  Patient presents with  . Follow-up    follow up Left ankle s/p I&D, DOS 10/25/14    BP 126/86 mmHg  Ht  (1.549 m)  Wt 145 lb (65.772 kg)  BMI 27.41 kg/m2   Wound has closed scabbed over recommend sterile bandage follow-up in a week possible weightbearing and boot at that point

## 2014-11-20 ENCOUNTER — Encounter: Payer: Self-pay | Admitting: Orthopedic Surgery

## 2014-11-20 ENCOUNTER — Ambulatory Visit (INDEPENDENT_AMBULATORY_CARE_PROVIDER_SITE_OTHER): Payer: Medicaid Other | Admitting: Orthopedic Surgery

## 2014-11-20 VITALS — BP 139/97 | Ht 61.0 in | Wt 145.0 lb

## 2014-11-20 DIAGNOSIS — Z4789 Encounter for other orthopedic aftercare: Secondary | ICD-10-CM

## 2014-11-20 DIAGNOSIS — G894 Chronic pain syndrome: Secondary | ICD-10-CM

## 2014-11-20 MED ORDER — OXYCODONE HCL 15 MG PO TABS: 15.0000 mg | ORAL_TABLET | Freq: Four times a day (QID) | ORAL | Status: DC | PRN

## 2014-11-20 NOTE — Progress Notes (Signed)
Chief Complaint  Patient presents with  . Follow-up    follow up Left ankle s/p I&D, DOS 10/25/14    Follow-up after incision and drainage sterile culture was noted. Wound is scabbed over recommend continued bandage over the last little bit of reddened uncovered skin. Weightbearing as tolerated in a boot follow-up in a month  Meds ordered this encounter  Medications  . oxyCODONE (ROXICODONE) 15 MG immediate release tablet    Sig: Take 1 tablet (15 mg total) by mouth every 6 (six) hours as needed for pain.    Dispense:  120 tablet    Refill:  0

## 2014-11-20 NOTE — Patient Instructions (Signed)
Start wearing boot and weight bearing

## 2014-11-30 ENCOUNTER — Other Ambulatory Visit: Payer: Self-pay | Admitting: *Deleted

## 2014-11-30 ENCOUNTER — Ambulatory Visit (INDEPENDENT_AMBULATORY_CARE_PROVIDER_SITE_OTHER): Payer: Medicaid Other | Admitting: Orthopedic Surgery

## 2014-11-30 ENCOUNTER — Ambulatory Visit (INDEPENDENT_AMBULATORY_CARE_PROVIDER_SITE_OTHER): Payer: Medicaid Other

## 2014-11-30 VITALS — BP 144/98 | Ht 61.0 in | Wt 145.0 lb

## 2014-11-30 DIAGNOSIS — M25572 Pain in left ankle and joints of left foot: Secondary | ICD-10-CM

## 2014-11-30 DIAGNOSIS — L03116 Cellulitis of left lower limb: Secondary | ICD-10-CM
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
## 2014-11-30 NOTE — Patient Instructions (Signed)
NO WEIGHT BEARING  

## 2014-12-01 ENCOUNTER — Ambulatory Visit (HOSPITAL_COMMUNITY)
Admission: RE | Admit: 2014-12-01 | Discharge: 2014-12-01 | Disposition: A | Discharge status: Home / Self Care | Payer: Medicaid Other | Source: Ambulatory Visit | Attending: Orthopedic Surgery | Admitting: Orthopedic Surgery

## 2014-12-01 DIAGNOSIS — L03116 Cellulitis of left lower limb: Secondary | ICD-10-CM | POA: Insufficient documentation

## 2014-12-01 NOTE — Progress Notes (Signed)
Peripherally Inserted Central Catheter/Midline Placement  The IV Nurse has discussed with the patient and/or persons authorized to consent for the patient, the purpose of this procedure and the potential benefits and risks involved with this procedure.  The benefits include less needle sticks, lab draws from the catheter and patient may be discharged home with the catheter.  Risks include, but not limited to, infection, bleeding, blood clot (thrombus formation), and puncture of an artery; nerve damage and irregular heat beat.  Alternatives to this procedure were also discussed.  PICC/Midline Placement Documentation  PICC / Midline Single Lumen 12/01/14 PICC Right Basilic 40 cm 1 cm (Active)  Indication for Insertion or Continuance of Line Home intravenous therapies (PICC only) 12/01/2014 10:57 AM  Exposed Catheter (cm) 1 cm 12/01/2014 10:57 AM  Site Assessment Clean;Dry;Intact 12/01/2014 10:57 AM  Line Status Blood return noted;Capped (central line);Flushed 12/01/2014 10:57 AM  Dressing Type Transparent;Securing device 12/01/2014 10:57 AM  Dressing Status Clean;Dry;Antimicrobial disc in place;Intact 12/01/2014 10:57 AM  Line Care Connections checked and tightened 12/01/2014 10:57 AM  Line Adjustment (NICU/IV Team Only) No 12/01/2014 10:57 AM  Dressing Intervention New dressing 12/01/2014 10:57 AM  Dressing Change Due 12/08/14 12/01/2014 10:57 AM       Lajuane Leatham, Meredith ModyGina S 12/01/2014, 10:59 AM

## 2014-12-01 NOTE — Discharge Instructions (Signed)
PICC Insertion, Care After Refer to this sheet in the next few weeks. These instructions provide you with information on caring for yourself after your procedure. Your health care provider may also give you more specific instructions. Your treatment has been planned according to current medical practices, but problems sometimes occur. Call your health care provider if you have any problems or questions after your procedure. WHAT TO EXPECT AFTER THE PROCEDURE After your procedure, it is typical to have the following:  Mild discomfort at the insertion site. This should not last more than a day. HOME CARE INSTRUCTIONS  Rest at home for the remainder of the day after the procedure.  You may bend your arm and move it freely. If your PICC is near or at the bend of your elbow, avoid activity with repeated motion at the elbow.  Avoid lifting heavy objects as instructed by your health care provider.  Avoid using a crutch with the arm on the same side as your PICC. You may need to use a walker. Bandage Care  Keep your PICC bandage (dressing) clean and dry to prevent infection.  Ask your health care provider when you may shower. To keep the dressing dry, cover the PICC with plastic wrap and tape before showering. If the dressing does become wet, replace it right after the shower.  Do not soak in the bath, swim, or use hot tubs when you have a PICC.  Change the PICC dressing as instructed by your health care provider.  Change your PICC dressing if it becomes loose or wet. General PICC Care  Check the PICC insertion site daily for leakage, redness, swelling, or pain.  Flush the PICC as directed by your health care provider. Let your health care provider know right away if the PICC is difficult to flush or does not flush. Do not use force to flush the PICC.  Do not use a syringe that is less than 10 mL to flush the PICC.  Never pull or tug on the PICC.  Avoid blood pressure checks on the arm  with the PICC.  Keep your PICC identification card with you at all times.  Do not take the PICC out yourself. Only a trained health care professional should remove the PICC. SEEK MEDICAL CARE IF:  You have pain in your arm, ear, face, or teeth.  You have fever or chills.  You have drainage from the PICC insertion site.  You have redness or palpate a "cord" around the PICC insertion site.  You cannot flush the catheter. SEEK IMMEDIATE MEDICAL CARE IF:  You have swelling in the arm in which the PICC is inserted.   This information is not intended to replace advice given to you by your health care provider. Make sure you discuss any questions you have with your health care provider.   Document Released: 11/17/2012 Document Revised: 02/01/2013 Document Reviewed: 11/17/2012 Elsevier Interactive Patient Education 2016 [place] A peripherally inserted central catheter (PICC) is a long, thin, flexible tube that is inserted into a vein in the upper arm. It is a form of intravenous (IV) access. It is considered to be a "central" line because the tip of the PICC ends in a large vein in your chest. This large vein is called the superior vena cava (SVC). The PICC tip ends in the SVC because there is a lot of blood flow in the SVC. This allows medicines and IV fluids to be quickly distributed throughout the body. The  PICC is inserted using a sterile technique by a specially trained nurse or physician. After the PICC is inserted, a chest X-ray exam is done to be sure it is in the correct place.  A PICC may be placed for different reasons, such as:  To give medicines and liquid nutrition that can only be given through a central line. Examples are:  Certain antibiotic treatments.  Chemotherapy.  Total parenteral nutrition (TPN).  To take frequent blood samples.  To give IV fluids and blood products.  If there is difficulty placing a peripheral intravenous (PIV)  catheter. If taken care of properly, a PICC can remain in place for several months. A PICC can also allow a person to go home from the hospital early. Medicine and PICC care can be managed at home by a family member or home health care team. [place] A PICC? Problems with a PICC can occasionally occur. These may include the following:  A blood clot (thrombus) forming in or at the tip of the PICC. This can cause the PICC to become clogged. A clot-dissolving medicine called tissue plasminogen activator (tPA) can be given through the PICC to help break up the clot.  Inflammation of the vein (phlebitis) in which the PICC is placed. Signs of inflammation may include redness, pain at the insertion site, red streaks, or being able to feel a "cord" in the vein where the PICC is located.  Infection in the PICC or at the insertion site. Signs of infection may include fever, chills, redness, swelling, or pus drainage from the PICC insertion site.  PICC movement (malposition). The PICC tip may move from its original position due to excessive physical activity, forceful coughing, sneezing, or vomiting.  A break or cut in the PICC. It is important to not use scissors near the PICC.  Nerve or tendon irritation or injury during PICC insertion. WHAT SHOULD I KEEP IN MIND ABOUT ACTIVITIES WHEN I HAVE A PICC?  You may bend your arm and move it freely. If your PICC is near or at the bend of your elbow, avoid activity with repeated motion at the elbow.  Rest at home for the remainder of the day following PICC line insertion.  Avoid lifting heavy objects as instructed by your health care provider.  Avoid using a crutch with the arm on the same side as your PICC. You may need to use a walker. WHAT SHOULD I KNOW ABOUT MY PICC DRESSING?  Keep your PICC bandage (dressing) clean and dry to prevent infection.  Ask your health care provider when you may shower. Ask your health care  provider to teach you how to wrap the PICC when you do take a shower.  Change the PICC dressing as instructed by your health care provider.  Change your PICC dressing if it becomes loose or wet. WHAT SHOULD I KNOW ABOUT PICC CARE?  Check the PICC insertion site daily for leakage, redness, swelling, or pain.  Do not take a bath, swim, or use hot tubs when you have a PICC. Cover PICC line with clear plastic wrap and tape to keep it dry while showering.  Flush the PICC as directed by your health care provider. Let your health care provider know right away if the PICC is difficult to flush or does not flush. Do not use force to flush the PICC.  Do not use a syringe that is less than 10 mL to flush the PICC.  Never pull or  tug on the PICC.  Avoid blood pressure checks on the arm with the PICC.  Keep your PICC identification card with you at all times.  Do not take the PICC out yourself. Only a trained clinical professional should remove the PICC. SEEK IMMEDIATE MEDICAL CARE IF:  Your PICC is accidentally pulled all the way out. If this happens, cover the insertion site with a bandage or gauze dressing. Do not throw the PICC away. Your health care provider will need to inspect it.  Your PICC was tugged or pulled and has partially come out. Do not  push the PICC back in.  There is any type of drainage, redness, or swelling where the PICC enters the skin.  You cannot flush the PICC, it is difficult to flush, or the PICC leaks around the insertion site when it is flushed.  You hear a "flushing" sound when the PICC is flushed.  You have pain, discomfort, or numbness in your arm, shoulder, or jaw on the same side as the PICC.  You feel your heart "racing" or skipping beats.  You notice a hole or tear in the PICC.  You develop chills or a fever. MAKE SURE YOU:   Understand these instructions.  Will watch your condition.  Will get help right away if you are not doing well or get  worse.   This information is not intended to replace advice given to you by your health care provider. Make sure you discuss any questions you have with your health care provider.   Document Released: 08/03/2002 Document Revised: 02/17/2014 Document Reviewed: 10/04/2012 Elsevier Interactive Patient Education 2016 [employer] Peripherally Inserted Central Catheter/Midline Placement  The IV Nurse has discussed with the patient and/or persons authorized to consent for the patient, the purpose of this procedure and the potential benefits and risks involved with this procedure.  The benefits include less needle sticks, lab draws from the catheter and patient may be discharged home with the catheter.  Risks include, but not limited to, infection, bleeding, blood clot (thrombus formation), and puncture of an artery; nerve damage and irregular heat beat.  Alternatives to this procedure were also discussed.  PICC/Midline Placement Documentation  PICC / Midline Single Lumen 12/01/14 PICC Right Basilic 40 cm 1 cm (Active)  Indication for Insertion or Continuance of Line Home intravenous therapies (PICC only) 12/01/2014 10:57 AM  Exposed Catheter (cm) 1 cm 12/01/2014 10:57 AM  Site Assessment Clean;Dry;Intact 12/01/2014 10:57 AM  Line Status Blood return noted;Capped (central line);Flushed 12/01/2014 10:57 AM  Dressing Type Transparent;Securing device 12/01/2014 10:57 AM  Dressing Status Clean;Dry;Antimicrobial disc in place;Intact 12/01/2014 10:57 AM  Line Care Connections checked and tightened 12/01/2014 10:57 AM  Line Adjustment (NICU/IV Team Only) No 12/01/2014 10:57 AM  Dressing Intervention New dressing 12/01/2014 10:57 AM  Dressing Change Due 12/08/14 12/01/2014 10:57 AM       Zykerria Tanton, Meredith ModyGina S 12/01/2014, 10:59 AM

## 2014-12-02 MED ORDER — CLINDAMYCIN HCL 300 MG PO CAPS: 300.0000 mg | ORAL_CAPSULE | Freq: Three times a day (TID) | ORAL | Status: DC

## 2014-12-02 NOTE — Addendum Note (Signed)
Addended by: Fuller CanadaHARRISON, Monik Lins E on: 12/02/2014 11:52 AM   Modules accepted: Orders

## 2014-12-04 ENCOUNTER — Telehealth: Payer: Self-pay | Admitting: Orthopedic Surgery

## 2014-12-04 NOTE — Telephone Encounter (Signed)
Routing to Dr Harrison 

## 2014-12-04 NOTE — Telephone Encounter (Signed)
Call received from [facility] nurse, [name], who relays that she is with patient now; states patient is complaining of pain at a level 10. States she is currently taking her pain medication: oxyCODONE (ROXICODONE) 15 MG , as well as Motrin around the clock, and 6 Goody's powders per day.  [name] states low tolerance.  Patient's next scheduled appointment is Thursday, 12/07/14  Please advise.

## 2014-12-05 NOTE — Telephone Encounter (Signed)
OK WE WILL SEE THURS

## 2014-12-05 NOTE — Telephone Encounter (Signed)
Patient aware, no med changes at this time, will see patient Thursday

## 2014-12-05 NOTE — Telephone Encounter (Signed)
12/05/14 - Call received from [facility] pharmacist, Larita FifeLynn; relays that patient is receiving Vancomycin, however, has been taking Benadryl prior to dosing, then taking infusion at 30 minute increments; therefore, states that if patient were to have it every 12 hours, it would take literally all day, and if they increase dosage, as is indicated, concerned about patient's tolerance.  Also relay trough is 5.5.  Pharmacist's direct ph# is [phone].

## 2014-12-05 NOTE — Telephone Encounter (Signed)
Routing to Dr Harrison for review 

## 2014-12-06 NOTE — Telephone Encounter (Signed)
Will see thurs

## 2014-12-07 ENCOUNTER — Telehealth: Payer: Self-pay | Admitting: Orthopedic Surgery

## 2014-12-07 ENCOUNTER — Ambulatory Visit (INDEPENDENT_AMBULATORY_CARE_PROVIDER_SITE_OTHER): Payer: Medicaid Other | Admitting: Orthopedic Surgery

## 2014-12-07 VITALS — BP 156/102 | Ht 61.0 in | Wt 145.0 lb

## 2014-12-07 DIAGNOSIS — G894 Chronic pain syndrome: Secondary | ICD-10-CM

## 2014-12-07 MED ORDER — OXYCODONE HCL 15 MG PO TABS: 15.0000 mg | ORAL_TABLET | Freq: Four times a day (QID) | ORAL | Status: DC | PRN

## 2014-12-07 NOTE — Telephone Encounter (Signed)
[facility] from today, 12/06/14 - please advise

## 2014-12-07 NOTE — Addendum Note (Signed)
Addended by: Vickki HearingHARRISON, Mclain Freer E on: 12/07/2014 03:38 PM   Modules accepted: Orders

## 2014-12-07 NOTE — Telephone Encounter (Signed)
Spoke with pharmacist advised ok to fill early as noted on prescription

## 2014-12-07 NOTE — Progress Notes (Signed)
Patient ID: [name], female   DOB: 1970/12/15, 44 y.o.   MRN: [id number]  Follow up visit  Chief Complaint  Patient presents with  . Follow-up    1 week follow up left ankle, DOS 10/25/14    BP 156/102 mmHg  Ht 5\' 1"  (1.549 m)  Wt 145 lb (65.772 kg)  BMI 27.41 kg/m2  Encounter Diagnosis  Name Primary?  . Chronic pain syndrome Yes    [name] is now on vancomycin this was started last Friday. Her trough level was 5 which is a little bit low but she was having some red man syndrome which was treated with Benadryl and slowing down her medication.  She says when she did that she didn't even have to take the Benadryl  The foot swelling is down the swelling around the previous Achilles tendon lengthening site is down but she is having pain over the medial hamstring tendons at their attachment to the tibia  Tenderness is noted there is swelling is noted there. No new neurovascular findings she has hypersensitivity of the foot some signs of RSD slightly supinated foot alignment with plantigrade foot if not slightly dorsiflexed. Vitals recorded. Wound closed surrounding erythema much decreased foot swelling again decreased.  Ambulatory status is a supported by crutches  Knee joint no effusion knee stable.  Injection was given in the bursa of the past tendons  She will continue current dose of vancomycin  I refilled her pain medication prescription which she took up sooner then prescribed secondary to increased knee pain and increased leg pain  She should be fine with a new prescription same dosing because her foot and leg are better and I've given her an injection for the knee pain  Meds ordered this encounter  Medications  . DISCONTD: oxyCODONE (ROXICODONE) 15 MG immediate release tablet    Sig: Take 1 tablet (15 mg total) by mouth every 6 (six) hours as needed for pain.    Dispense:  120 tablet    Refill:  0    Ok to fill early  . oxyCODONE (ROXICODONE) 15 MG immediate  release tablet    Sig: Take 1 tablet (15 mg total) by mouth every 6 (six) hours as needed for pain.    Dispense:  120 tablet    Refill:  0    Ok to fill early   Consent was obtained timeout was completed [facility]   using 1% plain Lidocaine and 40 mg of DEPOMEDROL . This was well tolerated.

## 2014-12-14 ENCOUNTER — Telehealth: Payer: Self-pay | Admitting: Orthopedic Surgery

## 2014-12-14 ENCOUNTER — Ambulatory Visit (INDEPENDENT_AMBULATORY_CARE_PROVIDER_SITE_OTHER): Payer: Medicaid Other | Admitting: Orthopedic Surgery

## 2014-12-14 VITALS — BP 124/86 | Ht 61.0 in | Wt 145.0 lb

## 2014-12-14 DIAGNOSIS — L03116 Cellulitis of left lower limb: Secondary | ICD-10-CM

## 2014-12-14 DIAGNOSIS — Z4789 Encounter for other orthopedic aftercare: Secondary | ICD-10-CM

## 2014-12-14 NOTE — Telephone Encounter (Signed)
[facility] - ph# [phone] - states was advised that she is unable to accept verbal orders; states that a written order is needed for the Vancomycin, regarding continuing the current dose x 2 weeks. Please fax to "Attention: Pharmacist", fax# [phone].

## 2014-12-14 NOTE — Progress Notes (Signed)
Called [facility] and spoke with Lynden AngCathy- gave verbal orders to continue IV Vanc x 2 weeks same dose

## 2014-12-15 ENCOUNTER — Other Ambulatory Visit: Payer: Self-pay | Admitting: *Deleted

## 2014-12-15 NOTE — Telephone Encounter (Signed)
FAXED AS REQUESTED

## 2014-12-18 ENCOUNTER — Telehealth: Payer: Self-pay | Admitting: *Deleted

## 2014-12-18 NOTE — Telephone Encounter (Signed)
Labs obtained 12/11/14  WBC 5.5 BUN 8 VANCOMYCIN TROUGH 5.6   LABS TO BE SCANNED INTO CHART

## 2014-12-18 NOTE — Telephone Encounter (Signed)
CALLED PATIENT, NO ANSWER, LEFT VM 

## 2014-12-18 NOTE — Telephone Encounter (Signed)
SPOKE WITH PATIENT SHE REPORTS RASH AND ITCHING WITH THE IV MEDS EVEN WITH 2 BENADRYL, TOOK EVERYDAY UP UNTIL Sunday, HAS NOT TAKEN ANY DOSES SINCE Saturday.

## 2014-12-18 NOTE — Telephone Encounter (Signed)
Call the patient and ask her what is going on

## 2014-12-18 NOTE — Telephone Encounter (Signed)
Particia JasperAngie Williams, home health nurse with [facility], called to advise that patient has stopped her IV Vanc. Patient states it is making her itch. Angie was first advised that patient stopped Thursday but patient later stated that she had taken doses through Sat and nothing on Sunday. States patient has no plans to continue the IV antibiotic. Nurse also advised that even though patient has non weightbearing status she did walk to the door to greet her and rates her pain at a 6 out of 10.  Particia Jasperngie Williams contact # [phone]

## 2014-12-21 ENCOUNTER — Ambulatory Visit (INDEPENDENT_AMBULATORY_CARE_PROVIDER_SITE_OTHER): Payer: Medicaid Other | Admitting: Orthopedic Surgery

## 2014-12-21 ENCOUNTER — Telehealth: Payer: Self-pay | Admitting: *Deleted

## 2014-12-21 VITALS — BP 115/83 | Ht 61.0 in | Wt 145.0 lb

## 2014-12-21 DIAGNOSIS — L03116 Cellulitis of left lower limb: Secondary | ICD-10-CM

## 2014-12-21 DIAGNOSIS — Z4789 Encounter for other orthopedic aftercare: Secondary | ICD-10-CM

## 2014-12-21 NOTE — Telephone Encounter (Signed)
Also received call from [facility] nurse, Particia JasperAngie Williams, with questions related to patient "having taken herself off of the IV anti-biotics" - please call her direct ph# [phone].

## 2014-12-21 NOTE — Patient Instructions (Signed)
Continue oral antibiotic

## 2014-12-21 NOTE — Telephone Encounter (Signed)
Larita FifeLynn from [facility] called to discuss patient. Please advise Larita FifeLynn [phone]

## 2014-12-21 NOTE — Progress Notes (Signed)
Chief Complaint  Patient presents with  . Follow-up    1 week follow up left ankle, DOS 10/25/14   Follow-up patient with cellulitis/infection left leg treated with vancomycin with good result however she's having rashes 30 minutes into taking the vancomycin  Decreased swelling erythema and tenderness in the left leg. Foot and ankle area leg area have returned to her postop status prior to this recent bout of infection.  Currently clinically her leg is much improved and we are going to keep her on oral Bactrim for the next week, keep the line and in case we needed again and then I'll see her in a week.

## 2014-12-22 NOTE — Telephone Encounter (Signed)
Spoke with Larita FifeLynn, [facility] Pharmacist  Advised that IV Vanc is on hold, keep PICC line in place for now, patient to resume oral antibiotics and follow up in   office next week

## 2014-12-22 NOTE — Telephone Encounter (Signed)
Called home health nurse, no answer, left vm

## 2014-12-28 ENCOUNTER — Encounter: Payer: Self-pay | Admitting: Orthopedic Surgery

## 2014-12-28 ENCOUNTER — Ambulatory Visit (INDEPENDENT_AMBULATORY_CARE_PROVIDER_SITE_OTHER): Payer: Medicaid Other | Admitting: Orthopedic Surgery

## 2014-12-28 ENCOUNTER — Telehealth: Payer: Self-pay | Admitting: *Deleted

## 2014-12-28 VITALS — BP 160/106 | Ht 61.0 in | Wt 145.0 lb

## 2014-12-28 DIAGNOSIS — L03818 Cellulitis of other sites: Secondary | ICD-10-CM

## 2014-12-28 DIAGNOSIS — G894 Chronic pain syndrome: Secondary | ICD-10-CM

## 2014-12-28 MED ORDER — SULFAMETHOXAZOLE-TRIMETHOPRIM 800-160 MG PO TABS: 1.0000 | ORAL_TABLET | Freq: Two times a day (BID) | ORAL | Status: DC

## 2014-12-28 MED ORDER — OXYCODONE HCL 15 MG PO TABS: 15.0000 mg | ORAL_TABLET | Freq: Four times a day (QID) | ORAL | Status: DC | PRN

## 2014-12-28 NOTE — Telephone Encounter (Signed)
Dr Romeo AppleHarrison recom

## 2014-12-28 NOTE — Progress Notes (Signed)
Joyce HesselbachMaria comes back in for follow-up she had surgery on September 14 had a negative culture and then developed cellulitis on the Achilles release incision she had good result with vancomycin but then started having rash is so we took her off of that she went on clindamycin had a reaction to that  Today her leg does not look infected although she is developing a supination deformity again  She was on clindamycin I want her on Bactrim so we switch her to Bactrim she'll keep the PICC line I'll see her in 2 weeks her medicine for pain was refilled  Recommend ID consult I think she will probably wind up with problems again and I mentioned to her that her leg is at risk for amputation

## 2014-12-29 NOTE — Telephone Encounter (Signed)
REFERRAL FAXED TO [facility]

## 2015-01-02 ENCOUNTER — Ambulatory Visit: Payer: Medicaid Other

## 2015-01-03 ENCOUNTER — Other Ambulatory Visit: Payer: Self-pay | Admitting: Obstetrics & Gynecology

## 2015-01-08 ENCOUNTER — Encounter: Payer: Self-pay | Admitting: *Deleted

## 2015-01-08 ENCOUNTER — Ambulatory Visit (INDEPENDENT_AMBULATORY_CARE_PROVIDER_SITE_OTHER): Payer: Medicaid Other | Admitting: *Deleted

## 2015-01-08 DIAGNOSIS — Z3202 Encounter for pregnancy test, result negative: Secondary | ICD-10-CM

## 2015-01-08 DIAGNOSIS — Z3042 Encounter for surveillance of injectable contraceptive: Secondary | ICD-10-CM | POA: Diagnosis not present

## 2015-01-08 LAB — POCT URINE PREGNANCY: PREG TEST UR: NEGATIVE

## 2015-01-08 MED ORDER — MEDROXYPROGESTERONE ACETATE 150 MG/ML IM SUSP
150.0000 mg | Freq: Once | INTRAMUSCULAR | Status: AC
  Administered 2015-01-08: 150 mg via INTRAMUSCULAR

## 2015-01-08 NOTE — Telephone Encounter (Signed)
appt 01/25/15 @ 2pm w/ DR Orvan FalconerAMPBELL

## 2015-01-08 NOTE — Progress Notes (Signed)
Patient ID: [name], female   DOB: [birth year], 44 y.o.   MRN: [id number] Depo Provera 150 mg IM given left ventrogluteal with no complications, pregnancy test negative. Pt to return in 12 weeks for next injection.

## 2015-01-11 ENCOUNTER — Ambulatory Visit (INDEPENDENT_AMBULATORY_CARE_PROVIDER_SITE_OTHER): Payer: Medicaid Other | Admitting: Orthopedic Surgery

## 2015-01-11 VITALS — BP 111/77 | Ht 61.0 in | Wt 145.0 lb

## 2015-01-11 DIAGNOSIS — G90522 Complex regional pain syndrome I of left lower limb: Secondary | ICD-10-CM

## 2015-01-11 DIAGNOSIS — Z4789 Encounter for other orthopedic aftercare: Secondary | ICD-10-CM

## 2015-01-11 NOTE — Progress Notes (Signed)
Follow-up visit surgery September 14.  Wound has healed she complains of hypersensitivity to the foot  I examined her and she indeed has hypersensitivity of the dorsum and plantar aspects of her foot  The posterior Achilles wound is healed the swelling is gone down she can resume weightbearing follow-up in 4 weeks  Stop all antibiotics once they have completed the course of Bactrim and she can have her PICC line removed follow-up in a month

## 2015-01-11 NOTE — Patient Instructions (Signed)
HOME HEALTH NURSE TO REMOVE PICC LINE  SOAK THREE TIMES DAILY IN EPSOM SALT

## 2015-01-22 ENCOUNTER — Telehealth: Payer: Self-pay | Admitting: *Deleted

## 2015-01-22 NOTE — Telephone Encounter (Signed)
Patient called requesting oxyCODONE (ROXICODONE) 15 MG to be refilled . Please advise [phone]

## 2015-01-23 ENCOUNTER — Other Ambulatory Visit: Payer: Self-pay | Admitting: *Deleted

## 2015-01-23 DIAGNOSIS — G894 Chronic pain syndrome: Secondary | ICD-10-CM

## 2015-01-23 MED ORDER — OXYCODONE HCL 15 MG PO TABS: 15.0000 mg | ORAL_TABLET | Freq: Four times a day (QID) | ORAL | Status: DC | PRN

## 2015-01-23 NOTE — Telephone Encounter (Signed)
Patient picked up her rx 

## 2015-01-23 NOTE — Telephone Encounter (Signed)
Patient is aware that rx is ready to be picked up.

## 2015-01-25 ENCOUNTER — Encounter: Payer: Self-pay | Admitting: Internal Medicine

## 2015-01-25 ENCOUNTER — Ambulatory Visit (INDEPENDENT_AMBULATORY_CARE_PROVIDER_SITE_OTHER): Payer: Medicaid Other | Admitting: Internal Medicine

## 2015-01-25 VITALS — BP 131/90 | HR 90 | Temp 98.0°F | Wt 140.0 lb

## 2015-01-25 DIAGNOSIS — G9050 Complex regional pain syndrome I, unspecified: Secondary | ICD-10-CM | POA: Diagnosis not present

## 2015-01-25 DIAGNOSIS — Z720 Tobacco use: Secondary | ICD-10-CM

## 2015-01-25 DIAGNOSIS — T814XXA Infection following a procedure, initial encounter: Secondary | ICD-10-CM

## 2015-01-25 DIAGNOSIS — IMO0001 Reserved for inherently not codable concepts without codable children: Secondary | ICD-10-CM

## 2015-01-25 DIAGNOSIS — F17210 Nicotine dependence, cigarettes, uncomplicated: Secondary | ICD-10-CM | POA: Insufficient documentation

## 2015-01-25 LAB — C-REACTIVE PROTEIN

## 2015-01-25 NOTE — Assessment & Plan Note (Signed)
She developed a late stitch abscess of her posterior ankle that was culture negative after being on oral cephalexin. Her infection has responded well to 2 IV courses of vancomycin and 1 course of oral trimethoprim sulfamethoxazole. She has no current evidence of active infection. I will continue observation off of antibiotics and check her inflammatory markers today. If she develops any signs of recurrent infection and possible abscess I would suggest trying to obtain appropriate specimens for stain and culture prior to restarting empiric antibiotics. I will see her back in one month.
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
## 2015-01-26 LAB — SEDIMENTATION RATE: SED RATE: 5 mm/h (ref 0–20)

## 2015-01-28 ENCOUNTER — Other Ambulatory Visit: Payer: Self-pay | Admitting: Orthopedic Surgery

## 2015-02-01 ENCOUNTER — Other Ambulatory Visit: Payer: Self-pay | Admitting: *Deleted

## 2015-02-01 MED ORDER — IBUPROFEN 800 MG PO TABS: 800.0000 mg | ORAL_TABLET | Freq: Three times a day (TID) | ORAL | Status: DC | PRN

## 2015-02-08 ENCOUNTER — Ambulatory Visit (INDEPENDENT_AMBULATORY_CARE_PROVIDER_SITE_OTHER): Payer: Medicaid Other | Admitting: Orthopedic Surgery

## 2015-02-08 VITALS — BP 133/89 | Ht 61.0 in | Wt 145.0 lb

## 2015-02-08 DIAGNOSIS — G90522 Complex regional pain syndrome I of left lower limb: Secondary | ICD-10-CM

## 2015-02-08 MED ORDER — DICYCLOMINE HCL 10 MG PO CAPS: 10.0000 mg | ORAL_CAPSULE | Freq: Three times a day (TID) | ORAL | Status: DC

## 2015-02-08 NOTE — Progress Notes (Signed)
Chief Complaint  Patient presents with  . Follow-up    4 week recheck on left ankle wound after picc line removal. DOS 10-25-14.    BP 133/89 mmHg  Ht 5\' 1"  (1.549 m)  Wt 145 lb (65.772 kg)  BMI 27.41 kg/m2   [name] is stable at this point no signs of infection. Plantigrade still hypersensitive  Medications are refilled she is having some difficulty with withdrawals apparently the pharmacist was refilling her medicine 30 day schedule when we had written for 28 days that led to her running out of her medicine and her withdrawal symptoms have been muscle ache in the back nausea vomiting so we will put her on some medications for that.  Meds ordered this encounter  Medications  . dicyclomine (BENTYL) 10 MG capsule    Sig: Take 1 capsule (10 mg total) by mouth 4 (four) times daily -  before meals and at bedtime.    Dispense:  90 capsule    Refill:  0

## 2015-02-14 ENCOUNTER — Other Ambulatory Visit: Payer: Self-pay | Admitting: *Deleted

## 2015-02-14 NOTE — Telephone Encounter (Signed)
I called to reschedule appoint due to our move, patient said she knows it is not time but Okey RegalCarol told her to let us know before we move about her refill on her oxycodone, patient requesting a 30 day supply when the prescription is ready for a refill. Please advise

## 2015-02-19 ENCOUNTER — Other Ambulatory Visit: Payer: Self-pay | Admitting: *Deleted

## 2015-02-19 DIAGNOSIS — G894 Chronic pain syndrome: Secondary | ICD-10-CM

## 2015-02-19 MED ORDER — OXYCODONE HCL 15 MG PO TABS: 15.0000 mg | ORAL_TABLET | Freq: Four times a day (QID) | ORAL | Status: DC | PRN

## 2015-02-19 NOTE — Telephone Encounter (Signed)
Patient called requesting to refill her oxycodone in a 30 day supply. Please advise [phone]

## 2015-02-20 ENCOUNTER — Encounter: Payer: Self-pay | Admitting: Orthopedic Surgery

## 2015-02-20 MED ORDER — OXYCODONE HCL 15 MG PO TABS: 15.0000 mg | ORAL_TABLET | Freq: Four times a day (QID) | ORAL | Status: DC | PRN

## 2015-02-20 NOTE — Addendum Note (Signed)
Addended by: Vickki HearingHARRISON, Parys Elenbaas E on: 02/20/2015 08:23 AM   Modules accepted: Orders

## 2015-03-01 ENCOUNTER — Encounter: Payer: Self-pay | Admitting: Internal Medicine

## 2015-03-01 ENCOUNTER — Ambulatory Visit (INDEPENDENT_AMBULATORY_CARE_PROVIDER_SITE_OTHER): Payer: Medicaid Other | Admitting: Internal Medicine

## 2015-03-01 VITALS — BP 129/90 | HR 105 | Temp 98.0°F | Wt 137.0 lb

## 2015-03-01 DIAGNOSIS — Z23 Encounter for immunization: Secondary | ICD-10-CM | POA: Diagnosis not present

## 2015-03-01 DIAGNOSIS — IMO0001 Reserved for inherently not codable concepts without codable children: Secondary | ICD-10-CM

## 2015-03-01 DIAGNOSIS — T814XXD Infection following a procedure, subsequent encounter: Secondary | ICD-10-CM

## 2015-03-01 NOTE — Progress Notes (Signed)
[facility]  Patient Active Problem List   Diagnosis Date Noted  . Postoperative wound infection 11/09/2013    Priority: High  . Trimalleolar fracture of left ankle 09/27/2013    Priority: High  . Reflex sympathetic dystrophy 01/25/2015    Priority: Medium  . Foreign body (FB) in soft tissue     Priority: Medium  . Joint contracture of lower leg 01/19/2014    Priority: Medium  . Cigarette smoker 01/25/2015  . HTN (hypertension) 05/14/2012  . Asthma 05/14/2012    Patient's Medications  New Prescriptions   No medications on file  Previous Medications   ALBUTEROL (PROVENTIL HFA;VENTOLIN HFA) 108 (90 BASE) MCG/ACT INHALER    Inhale 1-2 puffs into the lungs every 6 (six) hours as needed for wheezing or shortness of breath.   ALENDRONATE (FOSAMAX) 70 MG TABLET    Take 70 mg by mouth once a week.    AMITRIPTYLINE (ELAVIL) 50 MG TABLET    Take 50 mg by mouth at bedtime.   AMLODIPINE (NORVASC) 5 MG TABLET    Take 5 mg by mouth daily.   ASPIRIN-ACETAMINOPHEN (GOODYS BODY PAIN PO)    Take 1 packet by mouth daily as needed.    CHLORTHALIDONE (HYGROTON) 25 MG TABLET    Take 25 mg by mouth daily.   DICYCLOMINE (BENTYL) 10 MG CAPSULE    Take 1 capsule (10 mg total) by mouth 4 (four) times daily -  before meals and at bedtime.   IBUPROFEN (ADVIL,MOTRIN) 800 MG TABLET    Take 1 tablet (800 mg total) by mouth every 8 (eight) hours as needed.   MEDROXYPROGESTERONE ACETATE 150 MG/ML SUSY    INJECT 1 ML INTO THE MUSCLE EVERY 3 MONTHS AS DIRECTED   NORTRIPTYLINE (PAMELOR) 10 MG CAPSULE    Take 1 capsule (10 mg total) by mouth at bedtime.   OXYCODONE (ROXICODONE) 15 MG IMMEDIATE RELEASE TABLET    Take 1 tablet (15 mg total) by mouth every 6 (six) hours as needed for pain.   PROMETHAZINE (PHENERGAN) 25 MG TABLET    Take 1 tablet (25 mg total) by mouth every 6 (six) hours as needed for nausea or vomiting.  Modified Medications   No medications on file    Discontinued Medications   No medications on file    Subjective: [name] is in for her routine follow-up visit. She suffered a left ankle fracture in August 2015. She has had multiple surgeries. She developed a small stitch abscess over her Achilles tendon which required incision and drainage on 10/25/2014. She was treated with a course of IV then oral antibiotics and completed therapy in November. She continues to have severe pain in her leg and foot but she has had no evidence of recurrent infection.  Review of Systems: Review of Systems  Constitutional: Negative for fever, chills and diaphoresis.  Musculoskeletal: Positive for joint pain.    Past Medical History  Diagnosis Date  . Hypertension   . Drug abuse   . Asthma     as child  . Anemia     Social History  Substance Use Topics  . Smoking status: Current Every Day Smoker -- 1.00 packs/day for 17 years    Types: Cigarettes  . Smokeless tobacco: Never Used  . Alcohol Use: 0.0 oz/week    0 Standard drinks or equivalent per week     Comment: occasional    Family History  Problem Relation  Age of Onset  . Hypertension Father     Allergies  Allergen Reactions  . Clindamycin/Lincomycin Itching    Rash with itching  . Vancomycin     Possible hives ???  . Wellbutrin [Bupropion] Hives    Objective: Filed Vitals:   03/01/15 1452  BP: 129/90  Pulse: 105  Temp: 98 F (36.7 C)  TempSrc: Oral  Weight: 137 lb (62.143 kg)   Body mass index is 25.9 kg/(m^2).  Physical Exam  Constitutional: She is oriented to person, place, and time. No distress.  Cardiovascular: Normal rate and regular rhythm.   No murmur heard. Pulmonary/Chest: Breath sounds normal.  Musculoskeletal:  She has hypesthesia of her lower leg and foot. Her foot is warm and well perfused. All of her surgical incisions have healed nicely. There is no evidence of any active infection.  Neurological: She is alert and oriented to person, place, and  time.  Skin: No rash noted.  Psychiatric: Mood and affect normal.    Lab Results    Problem List Items Addressed This Visit      High   Postoperative wound infection - Primary    It appears that her stitch abscess has been cured by incision and drainage and antibiotic therapy last year. There is no sign of active infection at this time. She can follow-up here as needed.          Cliffton Asters, MD [facility] [phone] pager   [phone] cell 03/01/2015, 3:20 PM

## 2015-03-01 NOTE — Assessment & Plan Note (Signed)
It appears that her stitch abscess has been cured by incision and drainage and antibiotic therapy last year. There is no sign of active infection at this time. She can follow-up here as needed.

## 2015-03-15 ENCOUNTER — Ambulatory Visit: Payer: Medicaid Other | Admitting: Orthopedic Surgery

## 2015-03-19 ENCOUNTER — Ambulatory Visit (INDEPENDENT_AMBULATORY_CARE_PROVIDER_SITE_OTHER): Payer: Medicaid Other | Admitting: Orthopedic Surgery

## 2015-03-19 ENCOUNTER — Encounter: Payer: Self-pay | Admitting: Orthopedic Surgery

## 2015-03-19 VITALS — BP 129/85 | HR 82 | Temp 93.9°F | Ht 61.0 in | Wt 137.0 lb

## 2015-03-19 DIAGNOSIS — G894 Chronic pain syndrome: Secondary | ICD-10-CM | POA: Diagnosis not present

## 2015-03-19 DIAGNOSIS — G90522 Complex regional pain syndrome I of left lower limb: Secondary | ICD-10-CM | POA: Diagnosis not present

## 2015-03-19 DIAGNOSIS — M2450 Contracture, unspecified joint: Secondary | ICD-10-CM

## 2015-03-19 DIAGNOSIS — M24569 Contracture, unspecified knee: Secondary | ICD-10-CM

## 2015-03-19 DIAGNOSIS — S82852S Displaced trimalleolar fracture of left lower leg, sequela: Secondary | ICD-10-CM | POA: Diagnosis not present

## 2015-03-19 MED ORDER — OXYCODONE HCL 15 MG PO TABS: 15.0000 mg | ORAL_TABLET | Freq: Four times a day (QID) | ORAL | Status: DC | PRN

## 2015-03-19 NOTE — Progress Notes (Signed)
Patient ID: [name], female   DOB: [birth year], 45 y.o.   MRN: [id number]  Chief Complaint  Patient presents with  . Follow-up    1 month follow up Left ankle wound, DOS 10/25/14 I&D    BP 129/85 mmHg  Pulse 82  Temp(Src) 93.9 F (34.4 C)  Ht  (1.549 m)  Wt 137 lb (62.143 kg)  BMI 25.90 kg/m2  Encounter Diagnoses  Name Primary?  . Complex regional pain syndrome I of lower limb, left Yes  . Joint contracture of lower leg   . Trimalleolar fracture of ankle, closed, left, sequela   . Chronic pain syndrome    hpi : Follow-up for chronic pain after trimalleolar ankle fracture with complex regional pain syndrome joint contracture status post Achilles release  Still having withdrawal type symptoms  Review of Systems  Constitutional: Negative for fever.  Musculoskeletal: Positive for joint pain.  Neurological: Positive for tingling and sensory change.    BP 129/85 mmHg  Pulse 82  Temp(Src) 93.9 F (34.4 C)  Ht  (1.549 m)  Wt 137 lb (62.143 kg)  BMI 25.90 kg/m2 Ortho Exam She has hypersensitive areas medial distal ankle lateral distal ankle top of the foot. Loss of sensation small fourth and third digit. Pulse intact. Color improved. 5 dorsiflexion 5 plantar flexion tibialis anterior muscle spasm. Wounds are all closed healed but sensitive. Physical Exam  Constitutional: She is oriented to person, place, and time. She appears well-developed and well-nourished. No distress.  Musculoskeletal:  wbat in brace no other devices   Neurological: She is alert and oriented to person, place, and time.  Skin: Skin is warm and dry. No rash noted. No erythema.  Psychiatric: She has a normal mood and affect. Her behavior is normal.     ASSESSMENT AND PLAN   Chronic complex pain syndrome  Make referral to pain management specialist  Medication refill  Patient allowed to weight-bear as tolerated in the brace and she can remove it when in the house walking short  distances  Follow-up in one month

## 2015-03-19 NOTE — Patient Instructions (Signed)
We will refer you to pain management 

## 2015-03-20 ENCOUNTER — Telehealth: Payer: Self-pay | Admitting: *Deleted

## 2015-03-20 NOTE — Telephone Encounter (Signed)
Spoke with Hospital doctor at Dr. Bartholomew Crews office, and due to patient having medicaid, they have to make referral for pain management. I faxed her most recent office notes and xray results. Their office will call patient with referral appointment.

## 2015-03-20 NOTE — Telephone Encounter (Signed)
Patient was made aware that due to her insurance, her pcp will have to make the referral to pain management. I informed her that I faxed her office notes to them, and they will contact her with an appointment. She was advised if she did not hear back from them in one week to call their office.

## 2015-04-02 ENCOUNTER — Encounter: Payer: Self-pay | Admitting: *Deleted

## 2015-04-02 ENCOUNTER — Ambulatory Visit (INDEPENDENT_AMBULATORY_CARE_PROVIDER_SITE_OTHER): Payer: Medicaid Other | Admitting: *Deleted

## 2015-04-02 DIAGNOSIS — Z3202 Encounter for pregnancy test, result negative: Secondary | ICD-10-CM | POA: Diagnosis not present

## 2015-04-02 DIAGNOSIS — Z3042 Encounter for surveillance of injectable contraceptive: Secondary | ICD-10-CM | POA: Diagnosis not present

## 2015-04-02 LAB — POCT URINE PREGNANCY: Preg Test, Ur: NEGATIVE

## 2015-04-02 MED ORDER — MEDROXYPROGESTERONE ACETATE 150 MG/ML IM SUSP
150.0000 mg | Freq: Once | INTRAMUSCULAR | Status: AC
  Administered 2015-04-02: 150 mg via INTRAMUSCULAR

## 2015-04-02 NOTE — Progress Notes (Signed)
Pt here for Depo. Reports no problems at this time. Return in 12 weeks for next shot. JSY 

## 2015-04-16 ENCOUNTER — Ambulatory Visit (INDEPENDENT_AMBULATORY_CARE_PROVIDER_SITE_OTHER): Payer: Medicaid Other | Admitting: Orthopedic Surgery

## 2015-04-16 VITALS — BP 121/80 | Ht 61.0 in | Wt 137.0 lb

## 2015-04-16 DIAGNOSIS — G894 Chronic pain syndrome: Secondary | ICD-10-CM

## 2015-04-16 MED ORDER — PROMETHAZINE HCL 25 MG PO TABS: 25.0000 mg | ORAL_TABLET | Freq: Four times a day (QID) | ORAL | Status: DC | PRN

## 2015-04-16 MED ORDER — OXYCODONE HCL 15 MG PO TABS: 15.0000 mg | ORAL_TABLET | Freq: Four times a day (QID) | ORAL | Status: DC | PRN

## 2015-04-16 NOTE — Progress Notes (Signed)
Patient ID: [name], female   DOB: March 25, 1970, 45 y.o.   MRN: [id number]  Chief Complaint  Patient presents with  . Follow-up    1 month recheck on left ankle. DOS 10-25-14.    BP 121/80 mmHg  Ht 5\' 1"  (1.549 m)  Wt 137 lb (62.143 kg)  BMI 25.90 kg/m2  C/o swelling if on the foot too much, still hypersensitive in the foot  No swelling today     ASSESSMENT AND PLAN   Chronic pain  CRPS  RETURN 1 MONTH DECREASE TIME UP ON LEG  Meds ordered this encounter  Medications  . oxyCODONE (ROXICODONE) 15 MG immediate release tablet    Sig: Take 1 tablet (15 mg total) by mouth every 6 (six) hours as needed for pain.    Dispense:  120 tablet    Refill:  0  . promethazine (PHENERGAN) 25 MG tablet    Sig: Take 1 tablet (25 mg total) by mouth every 6 (six) hours as needed for nausea or vomiting.    Dispense:  90 tablet    Refill:  1

## 2015-05-18 ENCOUNTER — Ambulatory Visit (INDEPENDENT_AMBULATORY_CARE_PROVIDER_SITE_OTHER): Payer: Medicaid Other | Admitting: Orthopedic Surgery

## 2015-05-18 ENCOUNTER — Encounter: Payer: Self-pay | Admitting: Orthopedic Surgery

## 2015-05-18 VITALS — BP 131/88 | Ht 61.0 in | Wt 137.0 lb

## 2015-05-18 DIAGNOSIS — S82892A Other fracture of left lower leg, initial encounter for closed fracture: Secondary | ICD-10-CM | POA: Diagnosis not present

## 2015-05-18 DIAGNOSIS — G894 Chronic pain syndrome: Secondary | ICD-10-CM

## 2015-05-18 DIAGNOSIS — G90522 Complex regional pain syndrome I of left lower limb: Secondary | ICD-10-CM | POA: Diagnosis not present

## 2015-05-18 DIAGNOSIS — M2450 Contracture, unspecified joint: Secondary | ICD-10-CM | POA: Diagnosis not present

## 2015-05-18 DIAGNOSIS — M24569 Contracture, unspecified knee: Secondary | ICD-10-CM

## 2015-05-18 MED ORDER — OXYCODONE HCL 15 MG PO TABS: 15.0000 mg | ORAL_TABLET | Freq: Four times a day (QID) | ORAL | Status: DC | PRN
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
## 2015-05-24 ENCOUNTER — Telehealth: Payer: Self-pay

## 2015-05-24 NOTE — Telephone Encounter (Signed)
[facility] requesting foot xrays be sent to [phone].  Done.

## 2015-06-14 ENCOUNTER — Ambulatory Visit (INDEPENDENT_AMBULATORY_CARE_PROVIDER_SITE_OTHER): Payer: Medicaid Other | Admitting: Orthopedic Surgery

## 2015-06-14 VITALS — BP 134/88 | HR 103 | Ht 61.0 in | Wt 137.0 lb

## 2015-06-14 DIAGNOSIS — Z79899 Other long term (current) drug therapy: Secondary | ICD-10-CM

## 2015-06-14 DIAGNOSIS — G894 Chronic pain syndrome: Secondary | ICD-10-CM | POA: Diagnosis not present

## 2015-06-14 MED ORDER — OXYCODONE HCL 15 MG PO TABS: 15.0000 mg | ORAL_TABLET | Freq: Four times a day (QID) | ORAL | Status: DC | PRN

## 2015-06-14 NOTE — Progress Notes (Signed)
Patient ID: [name], female   DOB: 1970/04/24, 45 y.o.   MRN: [id number]  Chief Complaint  Patient presents with  . Follow-up    chronic left ankle pain    HPI- History of chronic pain after bimalleolar ankle fracture with RSD or chronic regional pain management syndrome stable no withdrawal signs are present no signs of sweating nausea vomiting  ROS  BP 134/88 mmHg  Pulse 103  Ht 5\' 1"  (1.549 m)  Wt 137 lb (62.143 kg)  BMI 25.90 kg/m2  Physical Exam Physical Exam  Constitutional: The patient is oriented to person, place, and time. The patient appears well-developed and well-nourished. Psychiatric: The patient has a normal mood and affect. Her behavior is normal. Judgment and thought content normal.   Ortho Exam    ASSESSMENT AND PLAN   Meds ordered this encounter  Medications  . oxyCODONE (ROXICODONE) 15 MG immediate release tablet    Sig: Take 1 tablet (15 mg total) by mouth every 6 (six) hours as needed for pain.    Dispense:  120 tablet    Refill:  0   To see pain management in a few weeks

## 2015-06-19 ENCOUNTER — Ambulatory Visit: Payer: Medicaid Other

## 2015-06-20 ENCOUNTER — Ambulatory Visit: Payer: Medicaid Other

## 2015-06-25 ENCOUNTER — Ambulatory Visit: Payer: Medicaid Other

## 2015-06-26 ENCOUNTER — Encounter: Payer: Self-pay | Admitting: *Deleted

## 2015-06-26 ENCOUNTER — Ambulatory Visit (INDEPENDENT_AMBULATORY_CARE_PROVIDER_SITE_OTHER): Payer: Medicaid Other | Admitting: *Deleted

## 2015-06-26 DIAGNOSIS — Z3202 Encounter for pregnancy test, result negative: Secondary | ICD-10-CM

## 2015-06-26 DIAGNOSIS — Z3042 Encounter for surveillance of injectable contraceptive: Secondary | ICD-10-CM

## 2015-06-26 LAB — POCT URINE PREGNANCY: Preg Test, Ur: NEGATIVE

## 2015-06-26 MED ORDER — MEDROXYPROGESTERONE ACETATE 150 MG/ML IM SUSP
150.0000 mg | Freq: Once | INTRAMUSCULAR | Status: AC
  Administered 2015-06-26: 150 mg via INTRAMUSCULAR

## 2015-06-26 NOTE — Progress Notes (Signed)
Pt here for Depo. Pt tolerated shot well. Return in 12 weeks for next shot. JSY 

## 2015-07-16 ENCOUNTER — Ambulatory Visit: Payer: Medicaid Other | Admitting: Orthopedic Surgery

## 2015-07-23 ENCOUNTER — Ambulatory Visit (INDEPENDENT_AMBULATORY_CARE_PROVIDER_SITE_OTHER): Payer: Medicaid Other | Admitting: Orthopedic Surgery

## 2015-07-23 ENCOUNTER — Encounter: Payer: Self-pay | Admitting: Orthopedic Surgery

## 2015-07-23 VITALS — BP 145/102 | Ht 60.5 in | Wt 138.0 lb

## 2015-07-23 DIAGNOSIS — Z79899 Other long term (current) drug therapy: Secondary | ICD-10-CM | POA: Diagnosis not present

## 2015-07-23 DIAGNOSIS — G894 Chronic pain syndrome: Secondary | ICD-10-CM | POA: Diagnosis not present

## 2015-07-23 DIAGNOSIS — S82892A Other fracture of left lower leg, initial encounter for closed fracture: Secondary | ICD-10-CM | POA: Diagnosis not present

## 2015-07-23 DIAGNOSIS — M2450 Contracture, unspecified joint: Secondary | ICD-10-CM | POA: Diagnosis not present

## 2015-07-23 DIAGNOSIS — M24569 Contracture, unspecified knee: Secondary | ICD-10-CM

## 2015-07-23 MED ORDER — OXYCODONE HCL 15 MG PO TABS
15.0000 mg | ORAL_TABLET | Freq: Four times a day (QID) | ORAL | Status: DC | PRN
Start: 2015-07-23 — End: 2015-07-23

## 2015-07-23 NOTE — Progress Notes (Signed)
Patient ID: [name], female   DOB: [birth year], 45 y.o.   MRN: [id number]  Chief Complaint  Patient presents with  . Follow-up    left ankle    HPI CHRONIC PAIN ANKLE CRPS Now followed at pain clinic. No changes in her overall condition she walks in the house without the boot and then walks out of the house or when she is out of the house with a short Cam Walker  ROS  BP 145/102 mmHg  Ht 5' 0.5" (1.537 m)  Wt 138 lb (62.596 kg)  BMI 26.50 kg/m2 Gen. appearance is normal grooming and hygiene Orientation to person place and time normal Mood normal Gait is abnormal supported by a short Cam Walker  No peripheral edema or swelling is noted in the LEFT FOOT Sensation hypersensitivity left foot she does have approximately 5 of active dorsiflexion Ortho Exam   A/P  Medical decision-making  Chronic pain syndrome chronic regional pain syndrome status post open treatment internal fixation left ankle  Follow-up one month  No prescription given today no drug testing done as patient now followed at pain clinic  Follow-up one month  Fuller CanadaStanley Harrison, MD 07/23/2015 2:43 PM

## 2015-08-27 ENCOUNTER — Ambulatory Visit (INDEPENDENT_AMBULATORY_CARE_PROVIDER_SITE_OTHER): Payer: Medicaid Other | Admitting: Orthopedic Surgery

## 2015-08-27 ENCOUNTER — Encounter: Payer: Self-pay | Admitting: Orthopedic Surgery

## 2015-08-27 VITALS — BP 125/83 | Ht 60.0 in | Wt 130.0 lb

## 2015-08-27 DIAGNOSIS — G894 Chronic pain syndrome: Secondary | ICD-10-CM | POA: Diagnosis not present

## 2015-08-27 NOTE — Progress Notes (Signed)
Follow-up visit  Patient currently on a chronic pain management on Nucynta ER 150 mg she is on gabapentin  She has complex regional pain syndrome  She has a plantigrade foot with metacarpophalangeal joint plantar flexion passive but no active extension of the toes minimal dorsiflexion of the ankle 10 plantarflexion 5 ankle stable a supination deformity has improved somewhat  She is in a Manufacturing systems engineerCam Walker. I offered her AFO and a shoe she can get a shoe on the foot because of the hypersensitivity  At this point we will see her in 3 months she will have chronic pain management with the chronic pain management facility.

## 2015-09-18 ENCOUNTER — Encounter: Payer: Self-pay | Admitting: Adult Health

## 2015-09-18 ENCOUNTER — Ambulatory Visit (INDEPENDENT_AMBULATORY_CARE_PROVIDER_SITE_OTHER): Payer: Medicaid Other | Admitting: Adult Health

## 2015-09-18 VITALS — BP 110/80 | HR 96 | Ht 61.0 in | Wt 133.0 lb

## 2015-09-18 DIAGNOSIS — Z3042 Encounter for surveillance of injectable contraceptive: Secondary | ICD-10-CM

## 2015-09-18 DIAGNOSIS — Z3202 Encounter for pregnancy test, result negative: Secondary | ICD-10-CM | POA: Diagnosis not present

## 2015-09-18 DIAGNOSIS — R300 Dysuria: Secondary | ICD-10-CM

## 2015-09-18 DIAGNOSIS — Z308 Encounter for other contraceptive management: Secondary | ICD-10-CM | POA: Diagnosis not present

## 2015-09-18 DIAGNOSIS — N390 Urinary tract infection, site not specified: Secondary | ICD-10-CM | POA: Diagnosis not present

## 2015-09-18 LAB — POCT URINALYSIS DIPSTICK
Ketones, UA: NEGATIVE
Leukocytes, UA: NEGATIVE
NITRITE UA: POSITIVE
Protein, UA: NEGATIVE
RBC UA: NEGATIVE

## 2015-09-18 LAB — POCT URINE PREGNANCY: PREG TEST UR: NEGATIVE

## 2015-09-18 MED ORDER — PHENAZOPYRIDINE HCL 200 MG PO TABS: 200.0000 mg | ORAL_TABLET | Freq: Three times a day (TID) | ORAL | 0 refills | Status: DC | PRN

## 2015-09-18 MED ORDER — MEDROXYPROGESTERONE ACETATE 150 MG/ML IM SUSP
150.0000 mg | Freq: Once | INTRAMUSCULAR | Status: AC
  Administered 2015-09-18: 150 mg via INTRAMUSCULAR

## 2015-09-18 MED ORDER — NITROFURANTOIN MONOHYD MACRO 100 MG PO CAPS: 100.0000 mg | ORAL_CAPSULE | Freq: Two times a day (BID) | ORAL | 0 refills | Status: DC

## 2015-09-18 NOTE — Progress Notes (Signed)
Pt here for Depo. Pt tolerated shot well. Return in 12 weeks for next shot. JSY 

## 2015-09-18 NOTE — Progress Notes (Signed)
Subjective:     Patient ID: [name], female   DOB: 1970/11/12, 45 y.o.   MRN: [id number]  HPI [name] is a 45 year old white female in complaining of burning with urination over about 1 1/2 weeks, has used AZO and it helps. She also got her depo today.  Review of Systems Patient denies any headaches, hearing loss, fatigue, blurred vision, shortness of breath, chest pain, abdominal pain, problems with bowel movements,  or intercourse. No joint pain or mood swings.+burning with urination Reviewed past medical,surgical, social and family history. Reviewed medications and allergies.     Objective:   Physical Exam BP 110/80 (BP Location: Left Arm, Patient Position: Sitting, Cuff Size: Normal)   Pulse 96   Ht 5\' 1"  (1.549 m)   Wt 133 lb (60.3 kg)   BMI 25.13 kg/m urine dipstick + nitrates and trace glucose, UPT negative, Skin warm and dry.Pelvic: external genitalia is normal in appearance no lesions, vagina: normal color, moisture and rugae,urethra has no lesions or masses noted, cervix:smooth and bulbous, uterus: normal size, shape and contour, non tender, no masses felt, adnexa: no masses or tenderness noted. Bladder is non tender and no masses felt.No CVAT, no abdominal tenderness noted.Encouraged to push fluids and not hold pee, or take tub bath or douche or use hot tub.She is made aware last pap in 2014.  Face time 15 minutes with 50 % counseling.    Assessment:    burning with urination UTI UPT negative Contraceptive management      Plan:     UA C&S sent Rx macrobid #14 1 bid x 7 days Rx pyridium 200 mg #10 take 2 now and 1 tid  Push fluids Review handout on UTI Return in September for pap and physical  Return in 3  Months for depo

## 2015-09-18 NOTE — Patient Instructions (Signed)
Urinary Tract Infection Urinary tract infections (UTIs) can develop anywhere along your urinary tract. Your urinary tract is your body's drainage system for removing wastes and extra water. Your urinary tract includes two kidneys, two ureters, a bladder, and a urethra. Your kidneys are a pair of bean-shaped organs. Each kidney is about the size of your fist. They are located below your ribs, one on each side of your spine. CAUSES Infections are caused by microbes, which are microscopic organisms, including fungi, viruses, and bacteria. These organisms are so small that they can only be seen through a microscope. Bacteria are the microbes that most commonly cause UTIs. SYMPTOMS  Symptoms of UTIs may vary by age and gender of the patient and by the location of the infection. Symptoms in young women typically include a frequent and intense urge to urinate and a painful, burning feeling in the bladder or urethra during urination. Older women and men are more likely to be tired, shaky, and weak and have muscle aches and abdominal pain. A fever may mean the infection is in your kidneys. Other symptoms of a kidney infection include pain in your back or sides below the ribs, nausea, and vomiting. DIAGNOSIS To diagnose a UTI, your caregiver will ask you about your symptoms. Your caregiver will also ask you to provide a urine sample. The urine sample will be tested for bacteria and white blood cells. White blood cells are made by your body to help fight infection. TREATMENT  Typically, UTIs can be treated with medication. Because most UTIs are caused by a bacterial infection, they usually can be treated with the use of antibiotics. The choice of antibiotic and length of treatment depend on your symptoms and the type of bacteria causing your infection. HOME CARE INSTRUCTIONS  If you were prescribed antibiotics, take them exactly as your caregiver instructs you. Finish the medication even if you feel better after  you have only taken some of the medication.  Drink enough water and fluids to keep your urine clear or pale yellow.  Avoid caffeine, tea, and carbonated beverages. They tend to irritate your bladder.  Empty your bladder often. Avoid holding urine for long periods of time.  Empty your bladder before and after sexual intercourse.  After a bowel movement, women should cleanse from front to back. Use each tissue only once. SEEK MEDICAL CARE IF:   You have back pain.  You develop a fever.  Your symptoms do not begin to resolve within 3 days. SEEK IMMEDIATE MEDICAL CARE IF:   You have severe back pain or lower abdominal pain.  You develop chills.  You have nausea or vomiting.  You have continued burning or discomfort with urination. MAKE SURE YOU:   Understand these instructions.  Will watch your condition.  Will get help right away if you are not doing well or get worse.   This information is not intended to replace advice given to you by your health care provider. Make sure you discuss any questions you have with your health care provider.   Document Released: 11/06/2004 Document Revised: 10/18/2014 Document Reviewed: 03/07/2011 Elsevier Interactive Patient Education 2016 [employer] Push fluids  Take macrobid  Follow up in 3 months for depo and pap and physical in September

## 2015-09-19 LAB — MICROSCOPIC EXAMINATION: CASTS: NONE SEEN /LPF

## 2015-09-19 LAB — URINALYSIS, ROUTINE W REFLEX MICROSCOPIC
BILIRUBIN UA: NEGATIVE
Glucose, UA: NEGATIVE
KETONES UA: NEGATIVE
NITRITE UA: POSITIVE — AB
PH UA: 7 (ref 5.0–7.5)
Protein, UA: NEGATIVE
RBC UA: NEGATIVE
SPEC GRAV UA: 1.016 (ref 1.005–1.030)
UUROB: 1 mg/dL (ref 0.2–1.0)

## 2015-09-20 LAB — URINE CULTURE: ORGANISM ID, BACTERIA: NO GROWTH

## 2015-10-22 ENCOUNTER — Other Ambulatory Visit: Payer: Medicaid Other | Admitting: Adult Health

## 2015-12-03 ENCOUNTER — Ambulatory Visit (INDEPENDENT_AMBULATORY_CARE_PROVIDER_SITE_OTHER): Payer: Medicaid Other

## 2015-12-03 ENCOUNTER — Ambulatory Visit (INDEPENDENT_AMBULATORY_CARE_PROVIDER_SITE_OTHER): Payer: Medicaid Other | Admitting: Orthopedic Surgery

## 2015-12-03 VITALS — BP 161/90 | HR 76 | Ht 61.0 in | Wt 130.0 lb

## 2015-12-03 DIAGNOSIS — G894 Chronic pain syndrome: Secondary | ICD-10-CM

## 2015-12-03 DIAGNOSIS — S82852S Displaced trimalleolar fracture of left lower leg, sequela: Secondary | ICD-10-CM

## 2015-12-03 DIAGNOSIS — M25572 Pain in left ankle and joints of left foot: Secondary | ICD-10-CM

## 2015-12-03 NOTE — Progress Notes (Signed)
Patient ID: [name], female   DOB: 04-20-1970, 45 y.o.   MRN: [id number]  Chief Complaint  Patient presents with  . Follow-up    Recheck on left foot and ankle.    HPI [name] is a 45 y.o. female.   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  She has hypersensitivity throughout the plantar aspect and dorsal aspects of the foot ankle range of motion is 10 she can move her toes up and down the little bit  She has an incision on the back of the heel medial and lateral incisions which are healed but with severe scarring  Pulse and perfusion remain normal  She has overpull of the tibialis anterior and weakness of the peroneals  Encounter Diagnoses  Name Primary?  . Pain of joint of left ankle and foot Yes  . Chronic pain syndrome   . Trimalleolar fracture of ankle, closed, left, sequela      Recommend six-month follow-up  No interventions on the orthopedic side at this time

## 2015-12-07 ENCOUNTER — Other Ambulatory Visit: Payer: Self-pay | Admitting: Obstetrics & Gynecology

## 2015-12-10 ENCOUNTER — Ambulatory Visit: Payer: Medicaid Other

## 2015-12-11 ENCOUNTER — Ambulatory Visit: Payer: Medicaid Other

## 2015-12-14 ENCOUNTER — Ambulatory Visit (INDEPENDENT_AMBULATORY_CARE_PROVIDER_SITE_OTHER): Payer: Medicaid Other | Admitting: *Deleted

## 2015-12-14 DIAGNOSIS — Z3042 Encounter for surveillance of injectable contraceptive: Secondary | ICD-10-CM | POA: Diagnosis not present

## 2015-12-14 DIAGNOSIS — Z3202 Encounter for pregnancy test, result negative: Secondary | ICD-10-CM

## 2015-12-14 DIAGNOSIS — Z308 Encounter for other contraceptive management: Secondary | ICD-10-CM

## 2015-12-14 LAB — POCT URINE PREGNANCY: Preg Test, Ur: NEGATIVE

## 2015-12-14 MED ORDER — MEDROXYPROGESTERONE ACETATE 150 MG/ML IM SUSP
150.0000 mg | Freq: Once | INTRAMUSCULAR | Status: AC
  Administered 2015-12-14: 150 mg via INTRAMUSCULAR

## 2015-12-14 NOTE — Progress Notes (Signed)
Depo Provera 150 mg IM given right ventrogluteal with no complications, Negative pregnancy test. Pt to return in 12 weeks for next injection.

## 2016-03-07 ENCOUNTER — Encounter: Payer: Self-pay | Admitting: *Deleted

## 2016-03-07 ENCOUNTER — Ambulatory Visit (INDEPENDENT_AMBULATORY_CARE_PROVIDER_SITE_OTHER): Payer: Medicaid Other | Admitting: *Deleted

## 2016-03-07 DIAGNOSIS — Z3202 Encounter for pregnancy test, result negative: Secondary | ICD-10-CM

## 2016-03-07 DIAGNOSIS — Z3042 Encounter for surveillance of injectable contraceptive: Secondary | ICD-10-CM

## 2016-03-07 DIAGNOSIS — Z308 Encounter for other contraceptive management: Secondary | ICD-10-CM

## 2016-03-07 LAB — POCT URINE PREGNANCY: PREG TEST UR: NEGATIVE

## 2016-03-07 MED ORDER — MEDROXYPROGESTERONE ACETATE 150 MG/ML IM SUSP
150.0000 mg | Freq: Once | INTRAMUSCULAR | Status: AC
  Administered 2016-03-07: 150 mg via INTRAMUSCULAR

## 2016-03-07 NOTE — Progress Notes (Signed)
Pt here for Depo. Pt tolerated shot well. Return in 12 weeks for next shot. JSY 
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
## 2016-04-03 IMAGING — RF DG C-ARM 61-120 MIN
1 series · 7 of 7 positions shown · non-contrast
Comparison: Radiographs 09/18/2013.

CLINICAL DATA: Left ankle fracture dislocation.  ORIF.

EXAM:
DG C-ARM 61-120 MIN; LEFT ANKLE COMPLETE - 3+ VIEW

[Series 1: run · 7 of 7 slices shown]
[im 1/7]
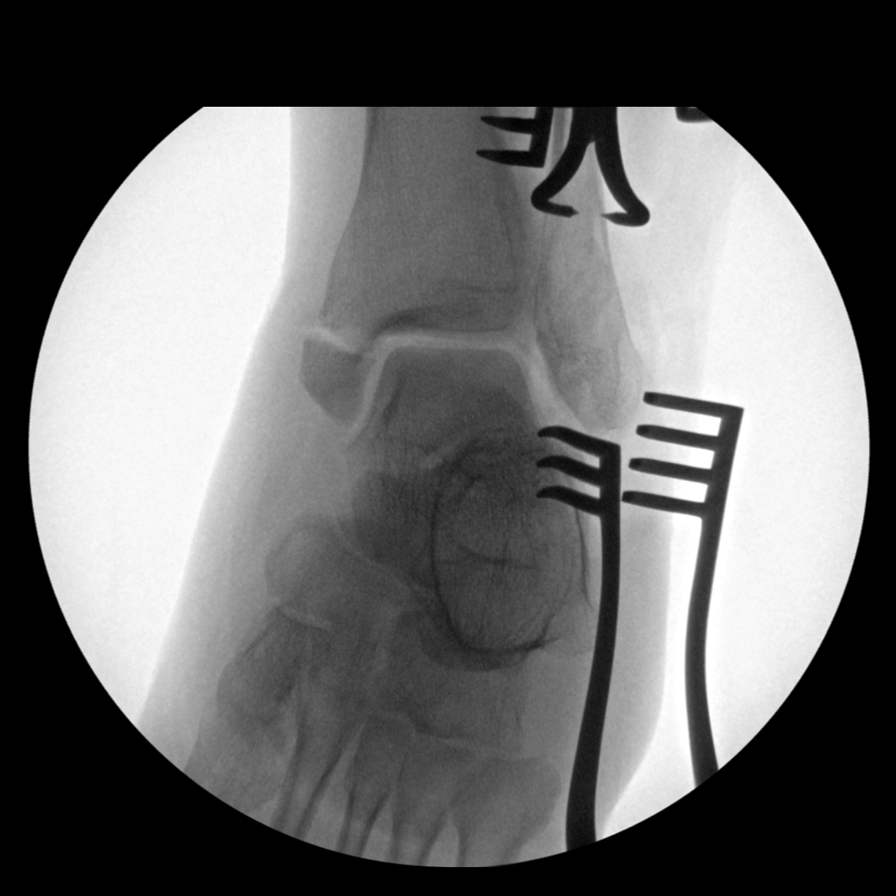
[im 2/7]
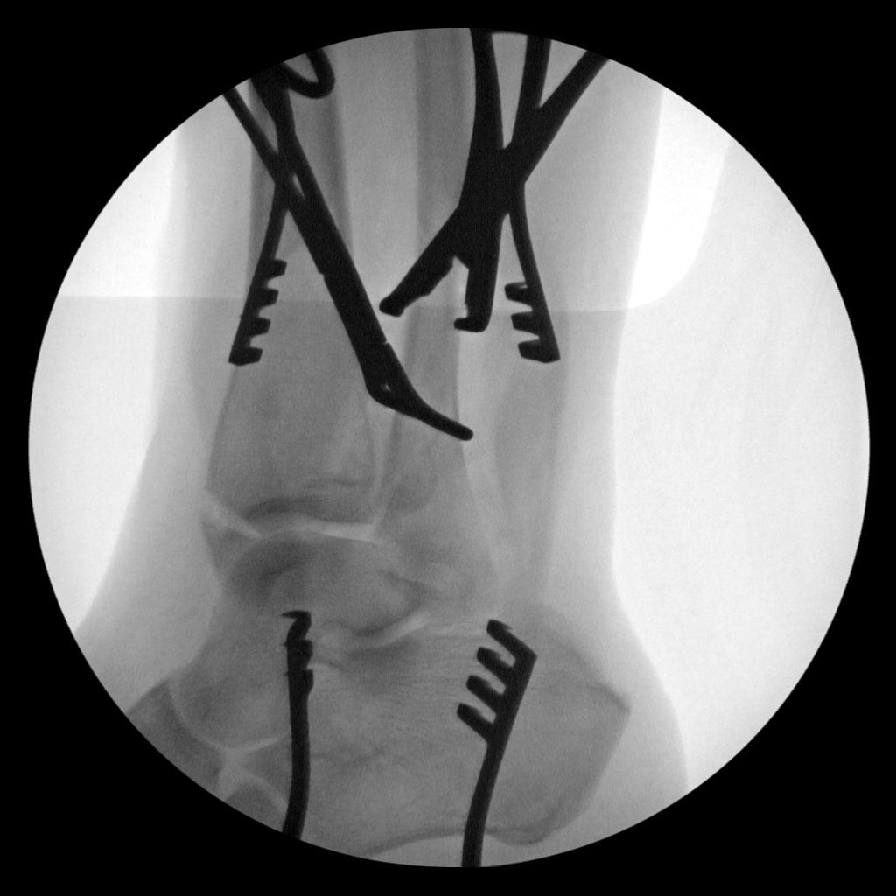
[im 3/7]
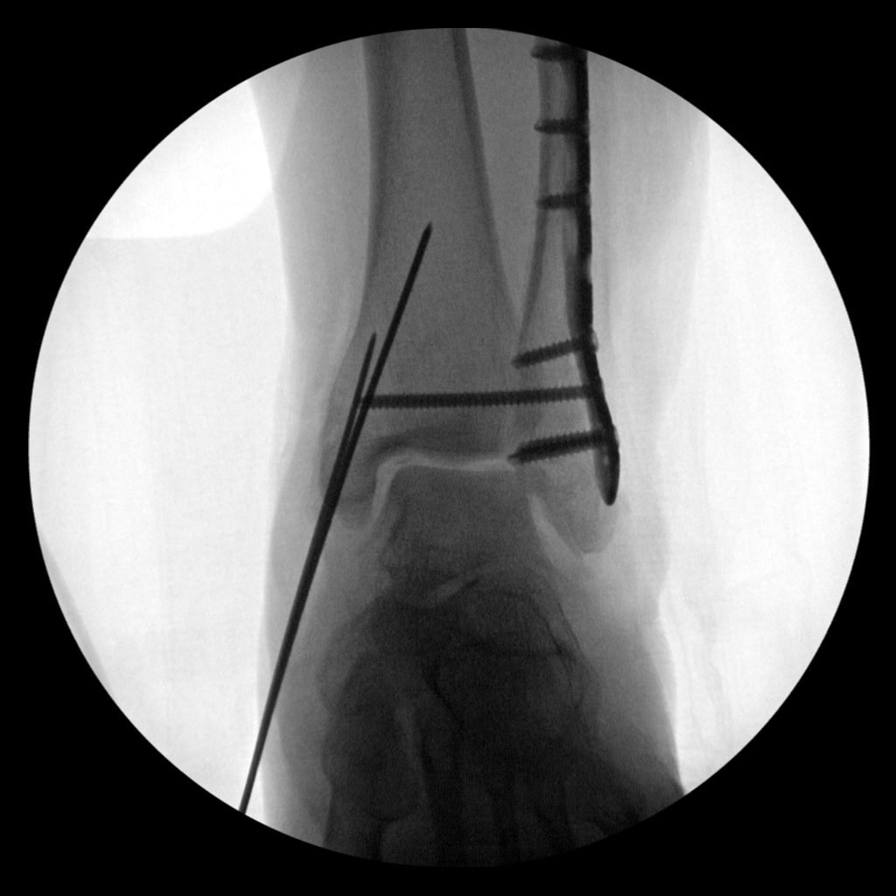
[im 4/7]
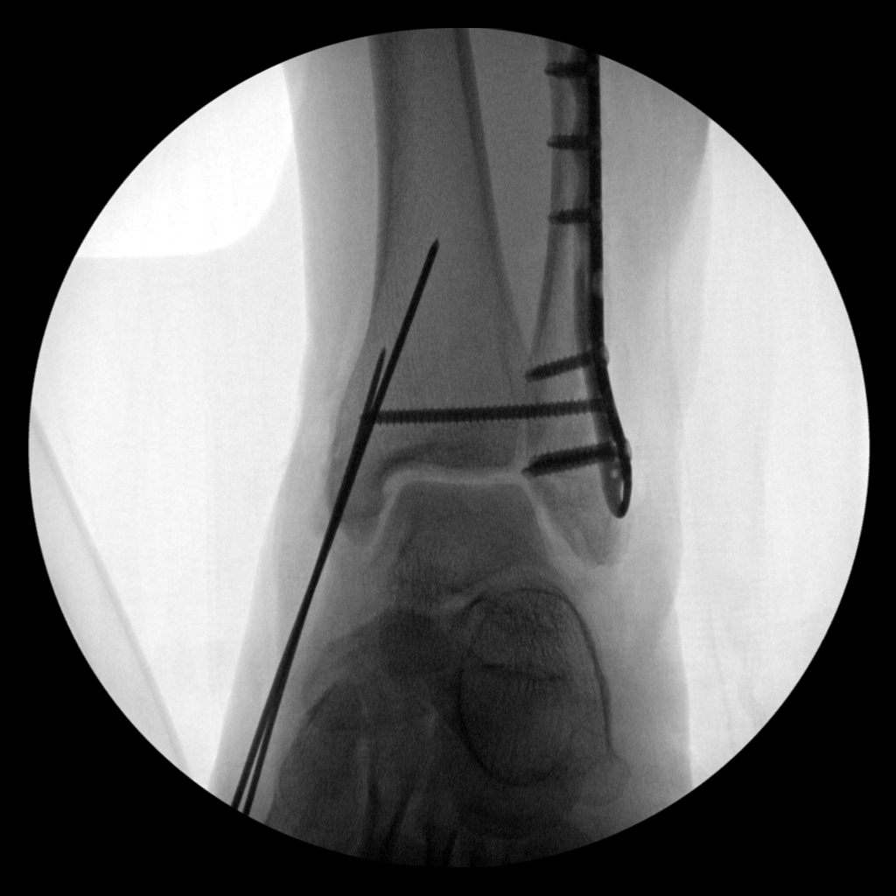
[im 5/7]
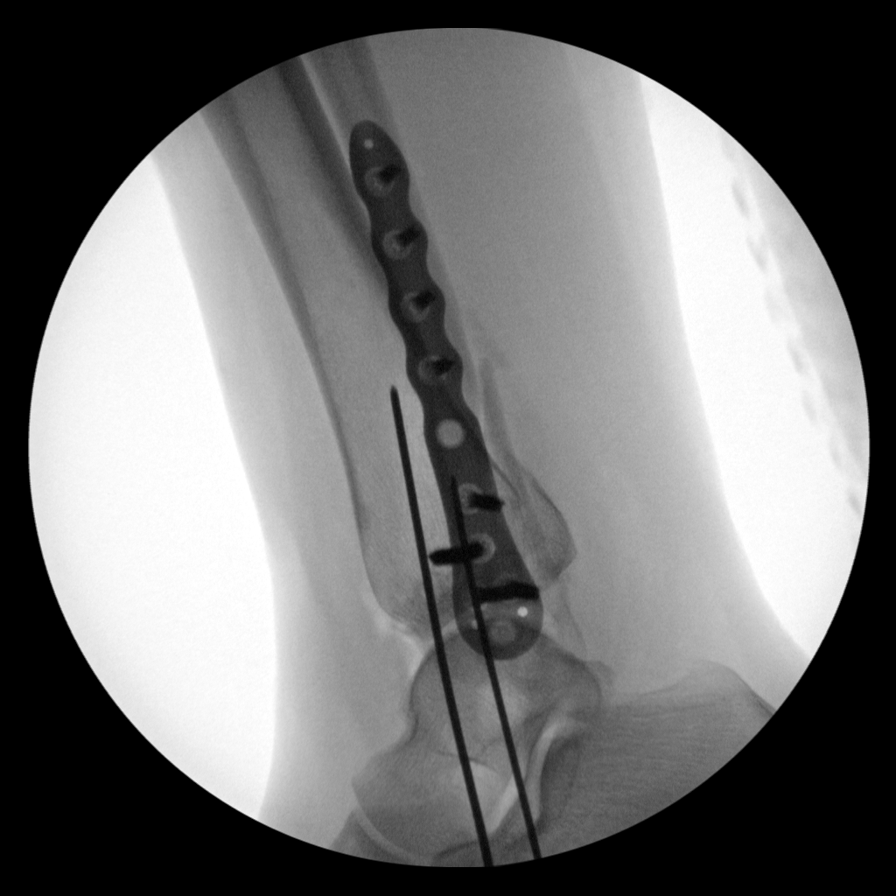
[im 6/7]
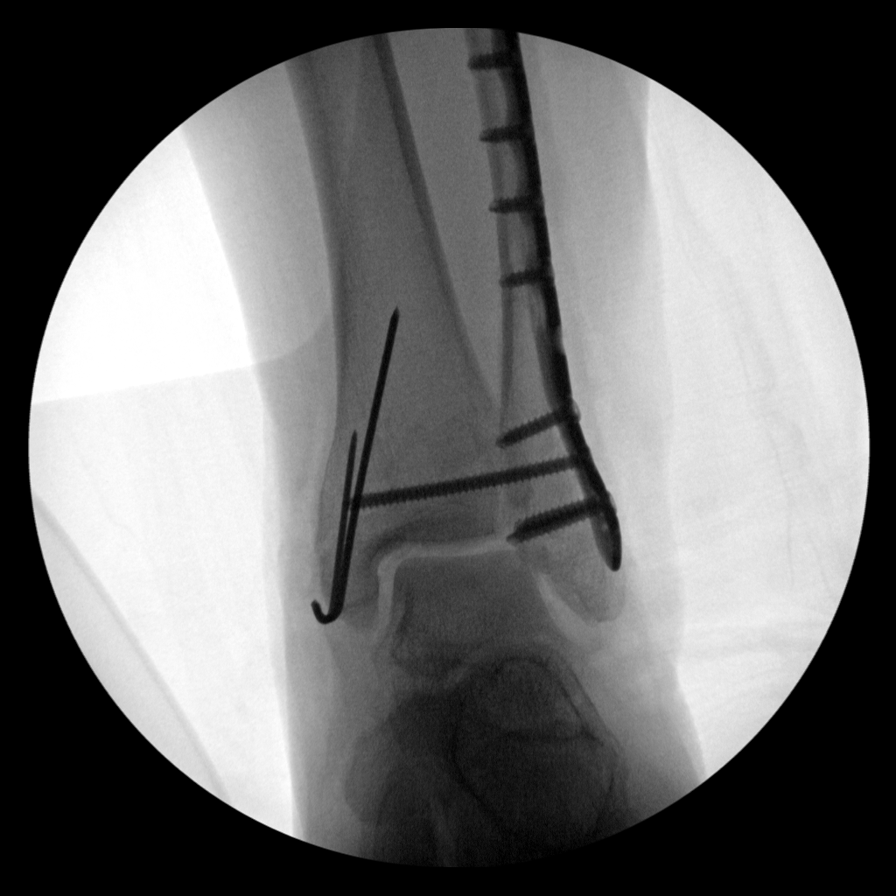
[im 7/7]
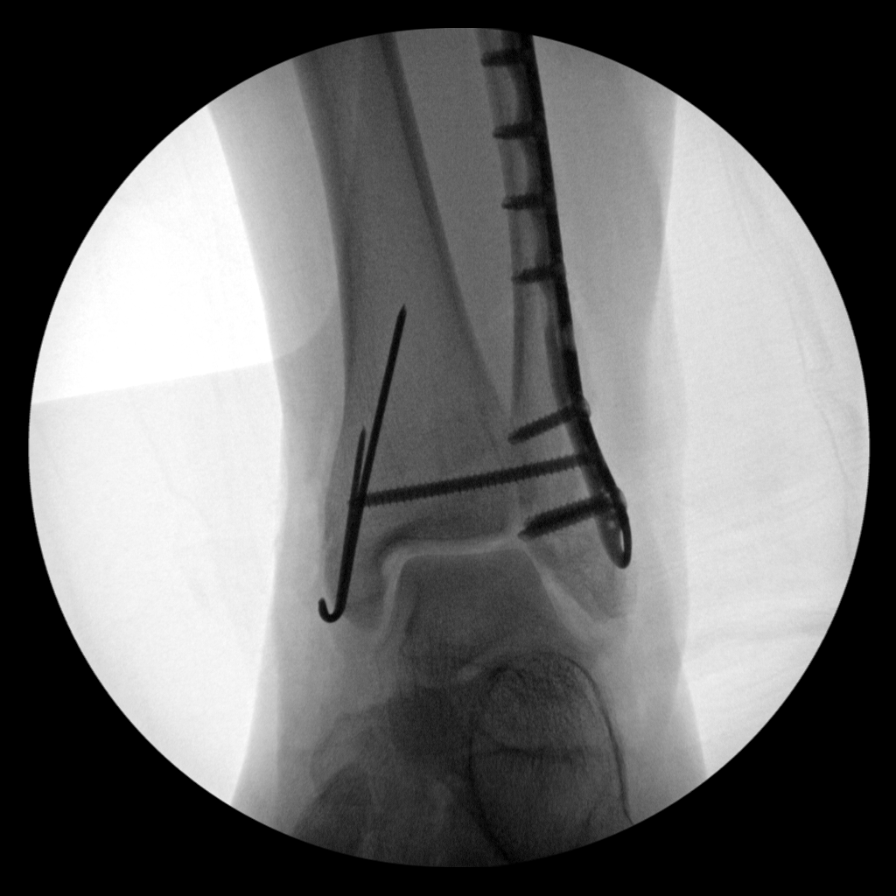

[7 of 7 positions shown; findings below may reference images not displayed]

FINDINGS: Seven spot fluoroscopic images are submitted from the operating
room. These demonstrate open reduction and internal fixation of the
distal fibular and tibial fractures using a lateral fibular plate
and screws, one traversing the distal tibiofibular joint. There are
2 K-wires traversing the fracture of the medial malleolus. There is
restoration of the ankle mortise. No complications are identified.
IMPRESSION: Near anatomic reduction of the left ankle fracture dislocation
status post ORIF.

## 2016-05-20 ENCOUNTER — Encounter: Payer: Self-pay | Admitting: Orthopedic Surgery

## 2016-05-20 ENCOUNTER — Ambulatory Visit (INDEPENDENT_AMBULATORY_CARE_PROVIDER_SITE_OTHER): Payer: Medicaid Other | Admitting: Orthopedic Surgery

## 2016-05-20 ENCOUNTER — Ambulatory Visit (INDEPENDENT_AMBULATORY_CARE_PROVIDER_SITE_OTHER): Payer: Medicaid Other

## 2016-05-20 DIAGNOSIS — G8929 Other chronic pain: Secondary | ICD-10-CM | POA: Diagnosis not present

## 2016-05-20 DIAGNOSIS — M25572 Pain in left ankle and joints of left foot: Secondary | ICD-10-CM

## 2016-05-20 NOTE — Progress Notes (Signed)
Patient ID: Annice Pih, female   DOB: Oct 02, 1970, 46 y.o.   MRN: [id number]  Chief Complaint  Patient presents with  . Follow-up    LEFT ANKLE FRACTURE, DOS 09/28/13    HPI OMEGA DURANTE is a 46 y.o. female.   Camilla complains of a episode (2 weeks ago) of swelling and redness which resolved with elevation of the leg She still has intermittent 5-6 out of 10 pain which is managed pain clinic with Lyrica  She did take some ibuprofen which resolved the redness and swelling but she said at that time was 10 out of 10 with excruciating pain and inability to weight-bear.   Review of Systems Review of Systems (2 MINIMUM) No fever at the time Erythema was noted around the leg No chest pain shortness of breath or rash   Past Medical History:  Diagnosis Date  . Anemia   . Asthma    as child  . Drug abuse   . Hypertension     Past Surgical History:  Procedure Laterality Date  . ACHILLES TENDON LENGTHENING Left 12/28/2013   Procedure: ACHILLES TENDON LENGTHENING LEFT;  Surgeon: Vickki Hearing, MD;  Location: [facility];  Service: Orthopedics;  Laterality: Left;  . CAST APPLICATION Left 12/28/2013   Procedure: SHORT LEG CAST APPLICATION ;  Surgeon: Vickki Hearing, MD;  Location: [facility];  Service: Orthopedics;  Laterality: Left;  . CHOLECYSTECTOMY    . HARDWARE REMOVAL Left 12/28/2013   Procedure: HARDWARE REMOVAL LEFT ANKLE;  Surgeon: Vickki Hearing, MD;  Location: [facility];  Service: Orthopedics;  Laterality: Left;  left ankle  . INCISION AND DRAINAGE Left 11/09/2013   Procedure: INCISION AND DRAINAGE LEFT ANKLE WOUND;  Surgeon: Vickki Hearing, MD;  Location: [facility];  Service: Orthopedics;  Laterality: Left;  . INCISION AND DRAINAGE ABSCESS Left 10/25/2014   Procedure: INCISION AND DRAINAGE LEFT LOWER LEG STITCH ABSCESS;  Surgeon: Vickki Hearing, MD;  Location: [facility];  Service: Orthopedics;  Laterality: Left;  . ORIF ANKLE FRACTURE Left 09/28/2013   Procedure: OPEN  REDUCTION INTERNAL FIXATION (ORIF) LEFT ANKLE;  Surgeon: Vickki Hearing, MD;  Location: [facility];  Service: Orthopedics;  Laterality: Left;    Social History Social History  Substance Use Topics  . Smoking status: Current Every Day Smoker    Packs/day: 1.00    Years: 18.00    Types: Cigarettes  . Smokeless tobacco: Never Used  . Alcohol use No    Allergies  Allergen Reactions  . Clindamycin/Lincomycin Itching    Rash with itching  . Vancomycin     Possible hives ???  . Wellbutrin [Bupropion] Hives    Current Meds  Medication Sig  . albuterol (PROVENTIL HFA;VENTOLIN HFA) 108 (90 BASE) MCG/ACT inhaler Inhale 1-2 puffs into the lungs every 6 (six) hours as needed for wheezing or shortness of breath.  Marland Kitchen alendronate (FOSAMAX) 70 MG tablet Take 70 mg by mouth once a week.   Marland Kitchen amLODipine (NORVASC) 5 MG tablet Take 5 mg by mouth daily.  . Aspirin-Acetaminophen (GOODYS BODY PAIN PO) Take 1 packet by mouth daily as needed.   . chlorthalidone (HYGROTON) 25 MG tablet Take 25 mg by mouth daily.  . MedroxyPROGESTERone Acetate 150 MG/ML SUSY INJECT 1 ML INTO THE MUSCLE EVERY 3 MONTHS AS DIRECTED  . pregabalin (LYRICA) 50 MG capsule Take 50 mg by mouth 3 (three) times daily.  . promethazine (PHENERGAN) 25 MG tablet Take 1 tablet (25 mg total) by mouth  every 6 (six) hours as needed for nausea or vomiting.      Physical Exam Physical Exam 1.There were no vitals taken for this visit.  2. Gen. appearance. The patient is well-developed and well-nourished, grooming and hygiene are normal. There are no gross congenital abnormalities  3. The patient is alert and oriented to person place and time  4. Mood and affect are normal  5. Ambulation Struggles with ambulation walks with a Cam Walker she has a noticeable limp    Examination reveals the following: Mediolateral scars which are still a little bit tender she has hypersensitivity plantar and dorsal aspect of the foot she has a  plantigrade foot she has minimal range of motion at the ankle joint. The ankle remain stable. She has atrophy. Overall limb alignment remains normal, pulses normal  In comparison the opposite ankle is normal with normal range of motion stability strength assessment   MEDICAL DECISION MAKING:    Data Reviewed X-ray show diffuse osteopenia normal plate and pins lateral and medially respectively ankle mortise intact.  Assessment Encounter Diagnosis  Name Primary?  . Chronic pain of left ankle Yes     Plan I suspect inflammatory condition but cannot rule out infection  I told her if it happens again we will be certain to get her prednisone as possible and I've asked her pain management clinic to allow to take ibuprofen which worked well this time FOLLOW up 6 months  Fuller Canada 05/20/2016, 3:07 PM

## 2016-05-30 ENCOUNTER — Ambulatory Visit (INDEPENDENT_AMBULATORY_CARE_PROVIDER_SITE_OTHER): Payer: Medicaid Other | Admitting: *Deleted

## 2016-05-30 DIAGNOSIS — Z3202 Encounter for pregnancy test, result negative: Secondary | ICD-10-CM

## 2016-05-30 DIAGNOSIS — Z3042 Encounter for surveillance of injectable contraceptive: Secondary | ICD-10-CM

## 2016-05-30 LAB — POCT URINE PREGNANCY: Preg Test, Ur: NEGATIVE

## 2016-05-30 MED ORDER — MEDROXYPROGESTERONE ACETATE 150 MG/ML IM SUSP
150.0000 mg | Freq: Once | INTRAMUSCULAR | Status: AC
  Administered 2016-05-30: 150 mg via INTRAMUSCULAR

## 2016-05-30 NOTE — Progress Notes (Signed)
Pt here for Depo injection, reports no problems at this time and UPT was negative.  Injection given in Rt hip without complication.  Pt instructed to return in 12 weeks for next injection.

## 2016-06-02 ENCOUNTER — Ambulatory Visit: Payer: Medicaid Other | Admitting: Orthopedic Surgery

## 2016-06-02 ENCOUNTER — Encounter: Payer: Self-pay | Admitting: Orthopedic Surgery

## 2016-08-22 ENCOUNTER — Ambulatory Visit: Payer: Medicaid Other

## 2016-08-27 ENCOUNTER — Ambulatory Visit: Payer: Medicaid Other

## 2016-09-02 ENCOUNTER — Other Ambulatory Visit: Payer: Medicaid Other

## 2016-09-04 ENCOUNTER — Other Ambulatory Visit: Payer: Medicaid Other

## 2016-09-04 ENCOUNTER — Ambulatory Visit: Payer: Medicaid Other

## 2016-09-24 ENCOUNTER — Ambulatory Visit: Payer: Medicaid Other

## 2016-09-24 ENCOUNTER — Other Ambulatory Visit: Payer: Medicaid Other

## 2016-11-18 ENCOUNTER — Encounter: Payer: Self-pay | Admitting: Orthopedic Surgery

## 2016-11-18 ENCOUNTER — Ambulatory Visit: Payer: Self-pay | Admitting: Orthopedic Surgery

## 2016-11-24 ENCOUNTER — Ambulatory Visit: Payer: Self-pay

## 2016-11-24 ENCOUNTER — Other Ambulatory Visit: Payer: Self-pay

## 2017-02-11 ENCOUNTER — Other Ambulatory Visit: Payer: Medicaid Other

## 2017-02-11 ENCOUNTER — Ambulatory Visit: Payer: Medicaid Other

## 2017-02-20 ENCOUNTER — Other Ambulatory Visit: Payer: Medicaid Other | Admitting: Adult Health

## 2017-03-11 ENCOUNTER — Other Ambulatory Visit: Payer: Medicaid Other | Admitting: Adult Health

## 2017-03-25 ENCOUNTER — Other Ambulatory Visit: Payer: Medicaid Other | Admitting: Adult Health

## 2017-04-22 ENCOUNTER — Other Ambulatory Visit (HOSPITAL_COMMUNITY)
Admission: RE | Admit: 2017-04-22 | Discharge: 2017-04-22 | Disposition: A | Discharge status: Home / Self Care | Payer: Medicaid Other | Source: Ambulatory Visit | Attending: Adult Health | Admitting: Adult Health

## 2017-04-22 ENCOUNTER — Encounter: Payer: Self-pay | Admitting: Adult Health

## 2017-04-22 ENCOUNTER — Ambulatory Visit (INDEPENDENT_AMBULATORY_CARE_PROVIDER_SITE_OTHER): Payer: Medicaid Other | Admitting: Adult Health

## 2017-04-22 VITALS — BP 150/96 | HR 90 | Ht 61.0 in | Wt 164.0 lb

## 2017-04-22 DIAGNOSIS — R87810 Cervical high risk human papillomavirus (HPV) DNA test positive: Secondary | ICD-10-CM | POA: Diagnosis not present

## 2017-04-22 DIAGNOSIS — Z309 Encounter for contraceptive management, unspecified: Secondary | ICD-10-CM

## 2017-04-22 DIAGNOSIS — Z113 Encounter for screening for infections with a predominantly sexual mode of transmission: Secondary | ICD-10-CM

## 2017-04-22 DIAGNOSIS — Z01411 Encounter for gynecological examination (general) (routine) with abnormal findings: Secondary | ICD-10-CM | POA: Diagnosis present

## 2017-04-22 DIAGNOSIS — Z3009 Encounter for other general counseling and advice on contraception: Secondary | ICD-10-CM

## 2017-04-22 DIAGNOSIS — I10 Essential (primary) hypertension: Secondary | ICD-10-CM

## 2017-04-22 DIAGNOSIS — Z01419 Encounter for gynecological examination (general) (routine) without abnormal findings: Secondary | ICD-10-CM

## 2017-04-22 DIAGNOSIS — Z3042 Encounter for surveillance of injectable contraceptive: Secondary | ICD-10-CM | POA: Insufficient documentation

## 2017-04-22 MED ORDER — MEDROXYPROGESTERONE ACETATE 150 MG/ML IM SUSY: 1.0000 mL | PREFILLED_SYRINGE | INTRAMUSCULAR | 4 refills | Status: DC

## 2017-04-22 MED ORDER — TRIAMTERENE-HCTZ 37.5-25 MG PO TABS: 1.0000 | ORAL_TABLET | Freq: Every day | ORAL | 4 refills | Status: DC

## 2017-04-22 NOTE — Progress Notes (Signed)
Patient ID: [name], female   DOB: 05-29-1970, 47 y.o.   MRN: [id number] History of Present Illness: Joyce HesselbachMaria is a 47 year old Hispanic female in for well woman gyn exam and pap and to get back on depo.She has not had depo in about a year.Last sex about 6 weeks ago. She is also off BP meds She was in [place] in 2015 with fractured left ankle and has not worked since then.  Saw Dr Polly CobiaHasanji last in October.    Current Medications, Allergies, Past Medical History, Past Surgical History, Family History and Social History were reviewed in [name] electronic medical record.     Review of Systems: Patient denies any headaches, hearing loss, fatigue, blurred vision, shortness of breath, chest pain, abdominal pain, problems with bowel movements, urination, or intercourse. No joint pain or mood swings.    Physical Exam:BP (!) 160/88 (BP Location: Left Arm, Patient Position: Sitting, Cuff Size: Normal)   Pulse 90   Ht 5\' 1"  (1.549 m)   Wt 164 lb (74.4 kg)   BMI 30.99 kg/m BP recheck 156 90 in left arm  General:  Well developed, well nourished, no acute distress Skin:  Warm and dry Neck:  Midline trachea, normal thyroid, good ROM, no lymphadenopathy Lungs; Clear to auscultation bilaterally Breast:  No dominant palpable mass, retraction, or nipple discharge Cardiovascular: Regular rate and rhythm Abdomen:  Soft, non tender, no hepatosplenomegaly Pelvic:  External genitalia is normal in appearance, no lesions.  The vagina is normal in appearance. Urethra has no lesions or masses. The cervix is smooth, pap with GC/CHL and HPV performed.  Uterus is felt to be normal size, shape, and contour.  No adnexal masses or tenderness noted.Bladder is non tender, no masses felt. Rectal: Good sphincter tone, no polyps, or hemorrhoids felt.  Hemoccult negative. Extremities/musculoskeletal:  No swelling or varicosities noted, no clubbing or cyanosis Psych:  No mood changes, alert and cooperative,seems  happy PHQ 2 score 0. Will refill depo and [facility]  Impression: 1. Encounter for gynecological examination with Papanicolaou smear of cervix   2. Family planning   3. Encounter for surveillance of injectable contraceptive   4. Screening examination for STD (sexually transmitted disease)   5. Essential hypertension       Plan: Check HIV and RPR Meds ordered this encounter  Medications  . triamterene-hydrochlorothiazide (MAXZIDE-25) 37.5-25 MG tablet    Sig: Take 1 tablet by mouth daily.    Dispense:  90 tablet    Refill:  4    Order Specific Question:   Supervising Provider    Answer:   Despina HiddenEURE, LUTHER H [[id number]]  . medroxyPROGESTERone Acetate 150 MG/ML SUSY    Sig: Inject 1 mL (150 mg total) into the muscle every 3 (three) months.    Dispense:  1 Syringe    Refill:  4    PATIENT    Order Specific Question:   Supervising Provider    Answer:   Lazaro ArmsEURE, LUTHER H [[id number]]  [facility] days [facility] and then depo in pm No sex Physical in 1 year Pap in 3 if normal Call [facility] dept about getting mammogram

## 2017-04-23 LAB — CYTOLOGY - PAP
CHLAMYDIA, DNA PROBE: NEGATIVE
Diagnosis: NEGATIVE
HPV: DETECTED — AB
NEISSERIA GONORRHEA: NEGATIVE

## 2017-04-24 ENCOUNTER — Telehealth: Payer: Self-pay | Admitting: Adult Health

## 2017-04-24 ENCOUNTER — Encounter: Payer: Self-pay | Admitting: Adult Health

## 2017-04-24 DIAGNOSIS — R87810 Cervical high risk human papillomavirus (HPV) DNA test positive: Secondary | ICD-10-CM

## 2017-04-24 HISTORY — DX: Cervical high risk human papillomavirus (HPV) DNA test positive: R87.810

## 2017-04-24 NOTE — Telephone Encounter (Signed)
Left message that pap is negative for malignancy and GC/CHL but +HPV will repeat pap in 1 year

## 2017-04-27 ENCOUNTER — Ambulatory Visit (INDEPENDENT_AMBULATORY_CARE_PROVIDER_SITE_OTHER): Payer: Medicaid Other

## 2017-04-27 ENCOUNTER — Other Ambulatory Visit: Payer: Medicaid Other

## 2017-04-27 VITALS — BP 120/80 | Wt 163.0 lb

## 2017-04-27 DIAGNOSIS — Z3042 Encounter for surveillance of injectable contraceptive: Secondary | ICD-10-CM

## 2017-04-27 DIAGNOSIS — Z3049 Encounter for surveillance of other contraceptives: Secondary | ICD-10-CM | POA: Diagnosis not present

## 2017-04-27 LAB — BETA HCG QUANT (REF LAB)

## 2017-04-27 MED ORDER — MEDROXYPROGESTERONE ACETATE 150 MG/ML IM SUSP
150.0000 mg | Freq: Once | INTRAMUSCULAR | Status: AC
  Administered 2017-04-27: 150 mg via INTRAMUSCULAR

## 2017-04-27 NOTE — Progress Notes (Addendum)
Pt here for depo injection 150 mg IM given rt ventrogluteal. Tolerated well. Return 12 weeks for next injection.negative HCG. Pad CMA

## 2017-04-28 LAB — RPR: RPR Ser Ql: NONREACTIVE

## 2017-04-28 LAB — HIV ANTIBODY (ROUTINE TESTING W REFLEX): HIV SCREEN 4TH GENERATION: NONREACTIVE

## 2017-07-20 ENCOUNTER — Ambulatory Visit: Payer: Medicaid Other

## 2017-07-24 ENCOUNTER — Ambulatory Visit (INDEPENDENT_AMBULATORY_CARE_PROVIDER_SITE_OTHER): Payer: Medicaid Other | Admitting: *Deleted

## 2017-07-24 ENCOUNTER — Encounter: Payer: Self-pay | Admitting: *Deleted

## 2017-07-24 ENCOUNTER — Other Ambulatory Visit: Payer: Self-pay

## 2017-07-24 ENCOUNTER — Other Ambulatory Visit: Payer: Self-pay | Admitting: Adult Health

## 2017-07-24 DIAGNOSIS — Z3042 Encounter for surveillance of injectable contraceptive: Secondary | ICD-10-CM

## 2017-07-24 DIAGNOSIS — Z3202 Encounter for pregnancy test, result negative: Secondary | ICD-10-CM | POA: Diagnosis not present

## 2017-07-24 DIAGNOSIS — Z3049 Encounter for surveillance of other contraceptives: Secondary | ICD-10-CM | POA: Diagnosis not present

## 2017-07-24 LAB — POCT URINE PREGNANCY: Preg Test, Ur: NEGATIVE

## 2017-07-24 MED ORDER — MEDROXYPROGESTERONE ACETATE 150 MG/ML IM SUSP
150.0000 mg | Freq: Once | INTRAMUSCULAR | Status: AC
  Administered 2017-07-24: 150 mg via INTRAMUSCULAR

## 2017-07-24 MED ORDER — TRIAMTERENE-HCTZ 37.5-25 MG PO TABS: 1.0000 | ORAL_TABLET | Freq: Every day | ORAL | 4 refills | Status: DC

## 2017-07-24 NOTE — Progress Notes (Signed)
Refilled maxzide 

## 2017-07-24 NOTE — Progress Notes (Signed)
Pt given DepoProvera 150mg IM left VG without complications. Advised to return in 12 weeks for next injection.  

## 2017-08-04 ENCOUNTER — Telehealth: Payer: Self-pay | Admitting: Adult Health

## 2017-08-04 NOTE — Telephone Encounter (Signed)
Left message letting pt know she can call [facility] and have them transfer prescription to pharmacy of her choice. JSY

## 2017-08-04 NOTE — Telephone Encounter (Signed)
[facility] [place]? PLease advise pt.

## 2017-10-16 ENCOUNTER — Ambulatory Visit: Payer: Medicaid Other

## 2017-10-21 ENCOUNTER — Ambulatory Visit: Payer: Medicaid Other

## 2017-11-09 ENCOUNTER — Ambulatory Visit: Payer: Medicaid Other

## 2017-11-09 ENCOUNTER — Other Ambulatory Visit: Payer: Medicaid Other

## 2017-11-18 ENCOUNTER — Ambulatory Visit (INDEPENDENT_AMBULATORY_CARE_PROVIDER_SITE_OTHER): Payer: Medicaid Other | Admitting: *Deleted

## 2017-11-18 ENCOUNTER — Other Ambulatory Visit: Payer: Medicaid Other

## 2017-11-18 DIAGNOSIS — Z3202 Encounter for pregnancy test, result negative: Secondary | ICD-10-CM

## 2017-11-18 DIAGNOSIS — Z3042 Encounter for surveillance of injectable contraceptive: Secondary | ICD-10-CM

## 2017-11-18 LAB — BETA HCG QUANT (REF LAB): hCG Quant: 2 m[IU]/mL

## 2017-11-18 LAB — POCT URINE PREGNANCY: Preg Test, Ur: NEGATIVE

## 2017-11-18 MED ORDER — MEDROXYPROGESTERONE ACETATE 150 MG/ML IM SUSP
150.0000 mg | Freq: Once | INTRAMUSCULAR | Status: AC
  Administered 2017-11-18: 150 mg via INTRAMUSCULAR

## 2017-11-18 NOTE — Progress Notes (Signed)
Pt in for depo provera injection. She was due to have injection between 8/30-9/13. Patient has not been sexually active in the past two weeks. She has a hcg quant of 2. Her urine pregnancy test is negative. Reviewed with Drenda Freeze and ok to give injection.  Depo Provera 150 mg given IM in right gluteal. Patient tolerated well. To return between 12/25 and 1/8.

## 2018-02-11 ENCOUNTER — Ambulatory Visit: Payer: Medicaid Other

## 2018-02-12 ENCOUNTER — Ambulatory Visit: Payer: Medicaid Other

## 2018-05-09 ENCOUNTER — Other Ambulatory Visit: Payer: Self-pay | Admitting: Adult Health

## 2018-06-10 ENCOUNTER — Other Ambulatory Visit: Payer: Self-pay | Admitting: Adult Health

## 2018-06-10 ENCOUNTER — Other Ambulatory Visit: Payer: Medicaid Other

## 2018-06-10 ENCOUNTER — Ambulatory Visit (INDEPENDENT_AMBULATORY_CARE_PROVIDER_SITE_OTHER): Payer: Medicaid Other | Admitting: *Deleted

## 2018-06-10 ENCOUNTER — Other Ambulatory Visit: Payer: Self-pay

## 2018-06-10 DIAGNOSIS — Z3042 Encounter for surveillance of injectable contraceptive: Secondary | ICD-10-CM

## 2018-06-10 LAB — BETA HCG QUANT (REF LAB): hCG Quant: 2 m[IU]/mL

## 2018-06-10 MED ORDER — MEDROXYPROGESTERONE ACETATE 150 MG/ML IM SUSP
150.0000 mg | Freq: Once | INTRAMUSCULAR | Status: AC
  Administered 2018-06-10: 150 mg via INTRAMUSCULAR

## 2018-06-10 NOTE — Progress Notes (Signed)
Quant negative. Depo Provera 150 mg given im in right ventrogluteal. Patient tolerated well. Next dose in 12 weeks.

## 2018-08-30 ENCOUNTER — Other Ambulatory Visit: Payer: Self-pay | Admitting: Adult Health

## 2018-09-01 ENCOUNTER — Telehealth: Payer: Self-pay | Admitting: Obstetrics & Gynecology

## 2018-09-01 NOTE — Telephone Encounter (Signed)
Reminded patient of depo

## 2018-09-02 ENCOUNTER — Ambulatory Visit (INDEPENDENT_AMBULATORY_CARE_PROVIDER_SITE_OTHER): Payer: Medicaid Other | Admitting: *Deleted

## 2018-09-02 ENCOUNTER — Other Ambulatory Visit: Payer: Self-pay

## 2018-09-02 DIAGNOSIS — Z3042 Encounter for surveillance of injectable contraceptive: Secondary | ICD-10-CM

## 2018-09-02 MED ORDER — MEDROXYPROGESTERONE ACETATE 150 MG/ML IM SUSP
150.0000 mg | Freq: Once | INTRAMUSCULAR | Status: AC
  Administered 2018-09-02: 150 mg via INTRAMUSCULAR

## 2018-09-02 NOTE — Progress Notes (Signed)
Depo Provera 150mg IM given in left VG with no complications. Pt to return in 12 weeks for next injection.  

## 2018-10-19 ENCOUNTER — Other Ambulatory Visit: Payer: Self-pay | Admitting: Adult Health

## 2018-11-24 ENCOUNTER — Telehealth: Payer: Self-pay | Admitting: Obstetrics & Gynecology

## 2018-11-24 NOTE — Telephone Encounter (Signed)

## 2018-11-25 ENCOUNTER — Ambulatory Visit: Payer: Medicaid Other

## 2018-11-26 ENCOUNTER — Ambulatory Visit: Payer: Medicaid Other

## 2018-11-29 ENCOUNTER — Telehealth: Payer: Self-pay | Admitting: Obstetrics & Gynecology

## 2018-11-29 NOTE — Telephone Encounter (Signed)

## 2018-11-30 ENCOUNTER — Ambulatory Visit: Payer: Medicaid Other

## 2018-12-09 ENCOUNTER — Telehealth: Payer: Self-pay | Admitting: *Deleted

## 2018-12-09 NOTE — Telephone Encounter (Signed)
Pt left message that she needs an appt with Maudie Mercury for her BP medicine. She states that her BP is running high and the ER told her she needs to see [place] to have med increased.

## 2018-12-13 ENCOUNTER — Telehealth: Payer: Self-pay | Admitting: Obstetrics & Gynecology

## 2018-12-13 NOTE — Telephone Encounter (Signed)
LVM for pt to call back to schedule.

## 2018-12-13 NOTE — Telephone Encounter (Signed)

## 2018-12-14 ENCOUNTER — Other Ambulatory Visit: Payer: Self-pay

## 2018-12-14 ENCOUNTER — Other Ambulatory Visit: Payer: Medicaid Other

## 2018-12-14 ENCOUNTER — Ambulatory Visit (INDEPENDENT_AMBULATORY_CARE_PROVIDER_SITE_OTHER): Payer: Medicaid Other | Admitting: *Deleted

## 2018-12-14 DIAGNOSIS — Z3042 Encounter for surveillance of injectable contraceptive: Secondary | ICD-10-CM

## 2018-12-14 LAB — BETA HCG QUANT (REF LAB): hCG Quant: 2 m[IU]/mL

## 2018-12-14 MED ORDER — MEDROXYPROGESTERONE ACETATE 150 MG/ML IM SUSP
150.0000 mg | Freq: Once | INTRAMUSCULAR | Status: AC
  Administered 2018-12-14: 150 mg via INTRAMUSCULAR

## 2018-12-14 NOTE — Progress Notes (Signed)
   NURSE VISIT- INJECTION  SUBJECTIVE:  [name] is a 48 y.o. G3P3 female here for a Depo Provera for contraception/period management. She is a GYN patient.   OBJECTIVE:  There were no vitals taken for this visit.  Appears well, in no apparent distress  Injection administered in: Right upper quad. gluteus  Meds ordered this encounter  Medications  . medroxyPROGESTERone (DEPO-PROVERA) injection 150 mg    ASSESSMENT: GYN patient Depo Provera for contraception/period management  PLAN: Follow-up: in 11-13 weeks for next Depo   Rash, Celene Squibb  12/14/2018 3:45 PM

## 2018-12-22 ENCOUNTER — Telehealth: Payer: Self-pay | Admitting: Women's Health

## 2018-12-22 NOTE — Telephone Encounter (Signed)

## 2018-12-23 ENCOUNTER — Ambulatory Visit: Payer: Medicaid Other | Admitting: Women's Health

## 2018-12-27 ENCOUNTER — Telehealth: Payer: Self-pay | Admitting: Women's Health

## 2018-12-27 NOTE — Telephone Encounter (Signed)

## 2018-12-28 ENCOUNTER — Encounter: Payer: Self-pay | Admitting: Adult Health

## 2018-12-28 ENCOUNTER — Ambulatory Visit (INDEPENDENT_AMBULATORY_CARE_PROVIDER_SITE_OTHER): Payer: Self-pay | Admitting: Adult Health

## 2018-12-28 ENCOUNTER — Other Ambulatory Visit: Payer: Self-pay

## 2018-12-28 VITALS — BP 142/94 | HR 87 | Ht 60.0 in | Wt 165.0 lb

## 2018-12-28 DIAGNOSIS — I10 Essential (primary) hypertension: Secondary | ICD-10-CM

## 2018-12-28 MED ORDER — TRIAMTERENE-HCTZ 37.5-25 MG PO TABS: 1.0000 | ORAL_TABLET | Freq: Every day | ORAL | 2 refills | Status: DC

## 2018-12-28 NOTE — Progress Notes (Addendum)
  Subjective:     Patient ID: [name], female   DOB: 1970-06-23, 48 y.o.   MRN: [id number]  HPI [name] is a 48 year old white female, G3P3 in for BP check and needs refill on meds, she also needs physical but declines today PCP is [facility].  Review of Systems Had to go to ER in [place] the end of October for pain in arms, and K+ was low, and BP was elevated, she was given K+then  Patient denies any headaches, hearing loss, fatigue, blurred vision, shortness of breath, chest pain, abdominal pain, problems with bowel movements, urination, or intercourse. No joint pain or mood swings.She is happy with depo  Reviewed past medical,surgical, social and family history. Reviewed medications and allergies.     Objective:   Physical Exam BP (!) 142/94 (BP Location: Left Arm, Patient Position: Sitting, Cuff Size: Normal)   Pulse 87   Ht 5' (1.524 m)   Wt 165 lb (74.8 kg)   BMI 32.22 kg/m  Skin warm and dry.  Lungs: clear to ausculation bilaterally. Cardiovascular: regular rate and rhythm. Fall risk is low PHQ 2 score is 0.    Assessment:     1. Essential hypertension       Plan:     Meds ordered this encounter  Medications  . triamterene-hydrochlorothiazide (MAXZIDE-25) 37.5-25 MG tablet    Sig: Take 1 tablet by mouth daily.    Dispense:  30 tablet    Refill:  2    Order Specific Question:   Supervising Provider    Answer:   Florian Buff [[id number]]   Request records from ER at [facility] and labs Return in 1 week for physical

## 2019-01-03 ENCOUNTER — Telehealth: Payer: Self-pay | Admitting: Adult Health

## 2019-01-03 NOTE — Telephone Encounter (Signed)

## 2019-01-04 ENCOUNTER — Other Ambulatory Visit: Payer: Medicaid Other | Admitting: Adult Health

## 2019-03-07 ENCOUNTER — Telehealth: Payer: Self-pay | Admitting: Obstetrics & Gynecology

## 2019-03-07 NOTE — Telephone Encounter (Signed)
Tried to reach the patient to remind her of her appointment/restrictions, mailbox is full. 

## 2019-03-08 ENCOUNTER — Ambulatory Visit: Payer: Medicaid Other

## 2019-03-09 ENCOUNTER — Telehealth: Payer: Self-pay | Admitting: *Deleted

## 2019-03-09 MED ORDER — TRIAMTERENE-HCTZ 37.5-25 MG PO TABS: 1.0000 | ORAL_TABLET | Freq: Every day | ORAL | 2 refills | Status: DC

## 2019-03-09 NOTE — Telephone Encounter (Signed)
Patient called requesting refill on BP meds and potassium.

## 2019-03-09 NOTE — Telephone Encounter (Signed)
l refill BP meds, pt needs appt for BP check

## 2019-03-10 ENCOUNTER — Telehealth: Payer: Self-pay | Admitting: Obstetrics & Gynecology

## 2019-03-10 NOTE — Telephone Encounter (Signed)
Tried to reach the patient to remind her of her appointment/restrictions, mailbox is full. 

## 2019-03-14 ENCOUNTER — Ambulatory Visit: Payer: Medicaid Other

## 2019-03-14 ENCOUNTER — Telehealth: Payer: Self-pay | Admitting: Obstetrics & Gynecology

## 2019-03-14 NOTE — Telephone Encounter (Signed)
Tried to reach the patient to remind her of her appointment/restrictions, unable to leave a message.

## 2019-03-15 ENCOUNTER — Encounter: Payer: Self-pay | Admitting: *Deleted

## 2019-03-15 ENCOUNTER — Other Ambulatory Visit: Payer: Self-pay

## 2019-03-15 ENCOUNTER — Ambulatory Visit (INDEPENDENT_AMBULATORY_CARE_PROVIDER_SITE_OTHER): Payer: Medicaid Other | Admitting: *Deleted

## 2019-03-15 VITALS — BP 175/108 | HR 86 | Ht 61.0 in | Wt 163.2 lb

## 2019-03-15 DIAGNOSIS — I10 Essential (primary) hypertension: Secondary | ICD-10-CM

## 2019-03-15 DIAGNOSIS — Z3042 Encounter for surveillance of injectable contraceptive: Secondary | ICD-10-CM | POA: Diagnosis not present

## 2019-03-15 MED ORDER — MEDROXYPROGESTERONE ACETATE 150 MG/ML IM SUSP
150.0000 mg | Freq: Once | INTRAMUSCULAR | Status: AC
  Administered 2019-03-15: 10:00:00 150 mg via INTRAMUSCULAR

## 2019-03-15 NOTE — Progress Notes (Signed)
   NURSE VISIT- BLOOD PRESSURE CHECK  SUBJECTIVE:  [name] is a 49 y.o. G3P3 female here for BP check. She is a GYN patient    HYPERTENSION ROS:  Pregnant/postpartum:  . Severe headaches that don't go away with tylenol/other medicines: No  . Visual changes (seeing spots/double/blurred vision) No  . Severe pain under right breast breast or in center of upper chest No  . Severe nausea/vomiting No  . Taking medicines as instructed has not picked medication up from pharmacy. Patient will pick up today.   GYN patient: . Taking medicines as instructed no . Headaches  No . Chest pain No . Shortness of breath Yes . Swelling in legs/ankles No  OBJECTIVE:  BP (!) 175/108 (BP Location: Left Arm, Patient Position: Sitting, Cuff Size: Normal)   Pulse 86   Ht 5\' 1"  (1.549 m)   Wt 163 lb 3.2 oz (74 kg)   BMI 30.84 kg/m   Appearance alert, well appearing, and in no distress.  ASSESSMENT: GYN  blood pressure check  PLAN: Discussed with , AGNP   Recommendations: no changes needed   Follow-up: one week   Depo Provera given in left gluteal.  Cyril Mourning  03/15/2019 9:57 AM

## 2019-03-15 NOTE — Progress Notes (Signed)
Chart reviewed for nurse visit. Agree with plan of care. PT to go get BP meds and take now and recheck in 1-2 weeks,stressed importance of taking BP meds  Adline Potter, NP 03/15/2019 1:49 PM

## 2019-03-22 ENCOUNTER — Telehealth: Payer: Self-pay | Admitting: Obstetrics & Gynecology

## 2019-03-22 NOTE — Telephone Encounter (Signed)

## 2019-03-23 ENCOUNTER — Encounter: Payer: Self-pay | Admitting: *Deleted

## 2019-03-23 ENCOUNTER — Ambulatory Visit (INDEPENDENT_AMBULATORY_CARE_PROVIDER_SITE_OTHER): Payer: Self-pay | Admitting: *Deleted

## 2019-03-23 ENCOUNTER — Other Ambulatory Visit: Payer: Self-pay

## 2019-03-23 VITALS — BP 155/108 | HR 93 | Ht 61.0 in | Wt 163.0 lb

## 2019-03-23 DIAGNOSIS — Z0130 Encounter for examination of blood pressure without abnormal findings: Secondary | ICD-10-CM

## 2019-03-23 MED ORDER — LOSARTAN POTASSIUM 25 MG PO TABS: 25.0000 mg | ORAL_TABLET | Freq: Every day | ORAL | 6 refills | Status: DC

## 2019-03-23 NOTE — Progress Notes (Signed)
   NURSE VISIT- BLOOD PRESSURE CHECK  SUBJECTIVE:  [name] is a 49 y.o. G3P3 female here for BP check. She is a GYN patient    HYPERTENSION ROS:  GYN patient: . Taking medicines as instructed yes . Headaches  No . Chest pain No . Shortness of breath yes . Swelling in legs/ankles No  OBJECTIVE:  BP (!) 155/108 (BP Location: Left Arm, Patient Position: Sitting, Cuff Size: Normal)   Pulse 93   Ht 5\' 1"  (1.549 m)   Wt 163 lb (73.9 kg)   BMI 30.80 kg/m   Appearance alert, well appearing, and in no distress and oriented to person, place, and time.  ASSESSMENT: GYN  blood pressure check  PLAN: Discussed with , AGNP   Recommendations: new prescription will be sent to take along with current BP med Follow-up: in 4 weeks for BP check  Cyril Mourning  03/23/2019 2:56 PM

## 2019-03-23 NOTE — Addendum Note (Signed)
Addended by: Cyril Mourning A on: 03/23/2019 04:48 PM   Modules accepted: Orders

## 2019-03-23 NOTE — Progress Notes (Signed)
Chart reviewed for nurse visit. Agree with plan of care. Will add losartan 25 mg, rx sent  Adline Potter, NP 03/23/2019 4:47 PM

## 2019-05-30 ENCOUNTER — Telehealth: Payer: Self-pay | Admitting: Obstetrics & Gynecology

## 2019-05-30 NOTE — Telephone Encounter (Signed)

## 2019-05-31 ENCOUNTER — Ambulatory Visit: Payer: Medicaid Other

## 2019-07-06 ENCOUNTER — Telehealth: Payer: Self-pay | Admitting: Obstetrics and Gynecology

## 2019-07-06 NOTE — Telephone Encounter (Signed)

## 2019-07-07 ENCOUNTER — Other Ambulatory Visit: Payer: Medicaid Other

## 2019-07-07 ENCOUNTER — Ambulatory Visit: Payer: Medicaid Other

## 2019-07-07 LAB — BETA HCG QUANT (REF LAB): hCG Quant: 2 m[IU]/mL

## 2019-07-12 ENCOUNTER — Ambulatory Visit: Payer: Medicaid Other

## 2019-07-12 ENCOUNTER — Telehealth: Payer: Self-pay | Admitting: *Deleted

## 2019-07-12 NOTE — Telephone Encounter (Signed)
Called patient back and left message I am calling with results.

## 2019-07-12 NOTE — Telephone Encounter (Signed)
Patient called back results given and depo rescheduled.

## 2019-07-12 NOTE — Telephone Encounter (Signed)
-----   Message from Adline Potter, NP sent at 07/07/2019  4:59 PM EDT ----- Let pt know QHCG 2,so negative

## 2019-07-13 ENCOUNTER — Ambulatory Visit: Payer: Medicaid Other | Admitting: *Deleted

## 2019-07-13 ENCOUNTER — Encounter: Payer: Self-pay | Admitting: *Deleted

## 2019-07-13 DIAGNOSIS — Z3042 Encounter for surveillance of injectable contraceptive: Secondary | ICD-10-CM

## 2019-07-13 DIAGNOSIS — Z308 Encounter for other contraceptive management: Secondary | ICD-10-CM

## 2019-07-13 MED ORDER — MEDROXYPROGESTERONE ACETATE 150 MG/ML IM SUSP
150.0000 mg | Freq: Once | INTRAMUSCULAR | Status: AC
  Administered 2019-07-13: 150 mg via INTRAMUSCULAR

## 2019-07-13 NOTE — Progress Notes (Signed)
   NURSE VISIT- INJECTION  SUBJECTIVE:  [name] is a 49 y.o. G3P3 female here for a Depo Provera for contraception/period management. She is a GYN patient. Pt was late for Depo. Had negative quant on 07/07/19. Has not had sex since then.   OBJECTIVE:  There were no vitals taken for this visit.  Appears well, in no apparent distress  Injection administered in: Right upper quad. gluteus  Meds ordered this encounter  Medications  . medroxyPROGESTERone (DEPO-PROVERA) injection 150 mg    ASSESSMENT: GYN patient Depo Provera for contraception/period management PLAN: Follow-up: in 11-13 weeks for next Depo   Malachy Mood  07/13/2019 10:08 AM

## 2019-10-04 ENCOUNTER — Telehealth: Payer: Self-pay | Admitting: Adult Health

## 2019-10-04 ENCOUNTER — Other Ambulatory Visit: Payer: Self-pay | Admitting: Adult Health

## 2019-10-04 NOTE — Telephone Encounter (Signed)
Patient needs medication refille for depo, pt scheduled tomorrow for depo inj

## 2019-10-04 NOTE — Telephone Encounter (Signed)
Patient would like refill of [facility] [place]. Patient is scheduled for depo inj tomorrow.

## 2019-10-05 ENCOUNTER — Ambulatory Visit: Payer: Medicaid Other

## 2019-10-05 MED ORDER — MEDROXYPROGESTERONE ACETATE 150 MG/ML IM SUSP: INTRAMUSCULAR | 4 refills | Status: DC

## 2019-10-05 NOTE — Telephone Encounter (Signed)
Refilled depo 

## 2019-10-05 NOTE — Addendum Note (Signed)
Addended by: Cyril Mourning A on: 10/05/2019 08:33 AM   Modules accepted: Orders

## 2019-10-11 ENCOUNTER — Ambulatory Visit: Payer: Medicaid Other

## 2019-10-12 ENCOUNTER — Ambulatory Visit (INDEPENDENT_AMBULATORY_CARE_PROVIDER_SITE_OTHER): Payer: Medicaid Other | Admitting: *Deleted

## 2019-10-12 DIAGNOSIS — Z308 Encounter for other contraceptive management: Secondary | ICD-10-CM

## 2019-10-12 DIAGNOSIS — Z3042 Encounter for surveillance of injectable contraceptive: Secondary | ICD-10-CM | POA: Diagnosis not present

## 2019-10-12 MED ORDER — MEDROXYPROGESTERONE ACETATE 150 MG/ML IM SUSP
150.0000 mg | Freq: Once | INTRAMUSCULAR | Status: AC
  Administered 2019-10-12: 150 mg via INTRAMUSCULAR

## 2019-10-12 NOTE — Progress Notes (Signed)
   NURSE VISIT- INJECTION  SUBJECTIVE:  [name] is a 49 y.o. G3P3 female here for a Depo Provera for contraception/period management. She is a GYN patient.   OBJECTIVE:  There were no vitals taken for this visit.  Appears well, in no apparent distress  Injection administered in: Left upper quad. gluteus  Meds ordered this encounter  Medications  . medroxyPROGESTERone (DEPO-PROVERA) injection 150 mg    ASSESSMENT: GYN patient Depo Provera for contraception/period management PLAN: Follow-up: in 11-13 weeks for next Depo   Malachy Mood  10/12/2019 4:59 PM

## 2019-11-16 ENCOUNTER — Other Ambulatory Visit: Payer: Self-pay | Admitting: Adult Health

## 2020-01-04 ENCOUNTER — Ambulatory Visit: Payer: Medicaid Other

## 2020-01-12 ENCOUNTER — Other Ambulatory Visit: Payer: Self-pay

## 2020-01-12 ENCOUNTER — Ambulatory Visit (INDEPENDENT_AMBULATORY_CARE_PROVIDER_SITE_OTHER): Payer: Medicaid Other | Admitting: *Deleted

## 2020-01-12 DIAGNOSIS — Z3042 Encounter for surveillance of injectable contraceptive: Secondary | ICD-10-CM | POA: Diagnosis not present

## 2020-01-12 MED ORDER — MEDROXYPROGESTERONE ACETATE 150 MG/ML IM SUSP
150.0000 mg | Freq: Once | INTRAMUSCULAR | Status: AC
  Administered 2020-01-12: 150 mg via INTRAMUSCULAR

## 2020-01-12 NOTE — Progress Notes (Signed)
   NURSE VISIT- INJECTION  SUBJECTIVE:  [name] is a 49 y.o. G3P3 female here for a Depo Provera for contraception/period management. She is a GYN patient.   OBJECTIVE:  There were no vitals taken for this visit.  Appears well, in no apparent distress  Injection administered in: Right upper quad. gluteus  Meds ordered this encounter  Medications  . medroxyPROGESTERone (DEPO-PROVERA) injection 150 mg    ASSESSMENT: GYN patient Depo Provera for contraception/period management PLAN: Follow-up: in 11-13 weeks for next Depo and PAP   Annamarie Dawley  01/12/2020 9:30 AM

## 2020-03-05 ENCOUNTER — Other Ambulatory Visit: Payer: Self-pay | Admitting: Adult Health

## 2020-03-29 ENCOUNTER — Ambulatory Visit: Payer: Medicaid Other

## 2020-03-30 ENCOUNTER — Other Ambulatory Visit: Payer: Medicaid Other | Admitting: Adult Health

## 2020-04-12 ENCOUNTER — Ambulatory Visit (INDEPENDENT_AMBULATORY_CARE_PROVIDER_SITE_OTHER): Payer: Medicaid Other | Admitting: *Deleted

## 2020-04-12 ENCOUNTER — Other Ambulatory Visit: Payer: Self-pay

## 2020-04-12 DIAGNOSIS — Z3042 Encounter for surveillance of injectable contraceptive: Secondary | ICD-10-CM | POA: Diagnosis not present

## 2020-04-12 MED ORDER — MEDROXYPROGESTERONE ACETATE 150 MG/ML IM SUSP
150.0000 mg | Freq: Once | INTRAMUSCULAR | Status: AC
  Administered 2020-04-12: 150 mg via INTRAMUSCULAR

## 2020-04-12 NOTE — Progress Notes (Signed)
° °  NURSE VISIT- INJECTION  SUBJECTIVE:  [name] is a 50 y.o. G3P3 female here for a Depo Provera for contraception/period management. She is a GYN patient.   OBJECTIVE:  There were no vitals taken for this visit.  Appears well, in no apparent distress  Injection administered in: Left upper quad. gluteus  No orders of the defined types were placed in this encounter.   ASSESSMENT: GYN patient Depo Provera for contraception/period management PLAN: Follow-up: in 11-13 weeks for next Depo   Annamarie Dawley  04/12/2020 4:15 PM

## 2020-05-23 ENCOUNTER — Other Ambulatory Visit: Payer: Medicaid Other | Admitting: Adult Health

## 2020-06-28 ENCOUNTER — Ambulatory Visit: Payer: Medicaid Other

## 2020-07-05 ENCOUNTER — Other Ambulatory Visit: Payer: Self-pay

## 2020-07-05 ENCOUNTER — Ambulatory Visit (INDEPENDENT_AMBULATORY_CARE_PROVIDER_SITE_OTHER): Payer: Medicaid Other | Admitting: *Deleted

## 2020-07-05 DIAGNOSIS — Z3042 Encounter for surveillance of injectable contraceptive: Secondary | ICD-10-CM | POA: Diagnosis not present

## 2020-07-05 DIAGNOSIS — Z308 Encounter for other contraceptive management: Secondary | ICD-10-CM

## 2020-07-05 MED ORDER — MEDROXYPROGESTERONE ACETATE 150 MG/ML IM SUSP
150.0000 mg | Freq: Once | INTRAMUSCULAR | Status: AC
  Administered 2020-07-05: 150 mg via INTRAMUSCULAR

## 2020-07-05 NOTE — Progress Notes (Signed)
   NURSE VISIT- INJECTION  SUBJECTIVE:  [name] is a 50 y.o. G3P3 female here for a Depo Provera for contraception/period management. She is a GYN patient.   OBJECTIVE:  There were no vitals taken for this visit.  Appears well, in no apparent distress  Injection administered in: Right upper quad. gluteus  Meds ordered this encounter  Medications  . medroxyPROGESTERone (DEPO-PROVERA) injection 150 mg    ASSESSMENT: GYN patient Depo Provera for contraception/period management PLAN: Follow-up: in 11-13 weeks for next Depo   Malachy Mood  07/05/2020 11:22 AM

## 2020-09-25 ENCOUNTER — Ambulatory Visit: Payer: Medicaid Other

## 2020-09-27 ENCOUNTER — Other Ambulatory Visit: Payer: Self-pay

## 2020-09-27 ENCOUNTER — Ambulatory Visit (INDEPENDENT_AMBULATORY_CARE_PROVIDER_SITE_OTHER): Payer: Self-pay | Admitting: *Deleted

## 2020-09-27 DIAGNOSIS — Z3042 Encounter for surveillance of injectable contraceptive: Secondary | ICD-10-CM

## 2020-09-27 MED ORDER — MEDROXYPROGESTERONE ACETATE 150 MG/ML IM SUSP
150.0000 mg | Freq: Once | INTRAMUSCULAR | Status: AC
  Administered 2020-09-27: 150 mg via INTRAMUSCULAR

## 2020-09-27 NOTE — Progress Notes (Signed)
   NURSE VISIT- INJECTION  SUBJECTIVE:  [name] is a 50 y.o. G3P3 female here for a Depo Provera for contraception/period management. She is a GYN patient.   OBJECTIVE:  There were no vitals taken for this visit.  Appears well, in no apparent distress  Injection administered in: Left upper quad. gluteus  Meds ordered this encounter  Medications   medroxyPROGESTERone (DEPO-PROVERA) injection 150 mg    ASSESSMENT: GYN patient Depo Provera for contraception/period management PLAN: Follow-up: in 11-13 weeks for next Depo   Jobe Marker  09/27/2020 10:17 AM

## 2020-12-03 ENCOUNTER — Other Ambulatory Visit: Payer: Self-pay | Admitting: Adult Health

## 2020-12-20 ENCOUNTER — Other Ambulatory Visit: Payer: Self-pay | Admitting: Adult Health

## 2020-12-20 ENCOUNTER — Ambulatory Visit: Payer: Medicaid Other

## 2020-12-25 ENCOUNTER — Other Ambulatory Visit: Payer: Self-pay

## 2020-12-25 ENCOUNTER — Ambulatory Visit (INDEPENDENT_AMBULATORY_CARE_PROVIDER_SITE_OTHER): Payer: Medicaid Other

## 2020-12-25 DIAGNOSIS — Z3042 Encounter for surveillance of injectable contraceptive: Secondary | ICD-10-CM

## 2020-12-25 MED ORDER — MEDROXYPROGESTERONE ACETATE 150 MG/ML IM SUSP
150.0000 mg | Freq: Once | INTRAMUSCULAR | Status: AC
  Administered 2020-12-25: 150 mg via INTRAMUSCULAR

## 2020-12-25 NOTE — Progress Notes (Signed)
   NURSE VISIT- INJECTION  SUBJECTIVE:  [name] is a 50 y.o. G3P3 female here for a Depo Provera for contraception/period management. She is a GYN patient.   OBJECTIVE:  There were no vitals taken for this visit.  Appears well, in no apparent distress  Injection administered in: Right upper quad. gluteus  Meds ordered this encounter  Medications   medroxyPROGESTERone (DEPO-PROVERA) injection 150 mg    ASSESSMENT: GYN patient Depo Provera for contraception/period management PLAN: Follow-up: in 11-13 weeks for next Depo   Danica Camarena A Daqwan Dougal  12/25/2020 10:48 AM

## 2021-03-19 ENCOUNTER — Ambulatory Visit: Payer: Medicaid Other

## 2021-05-09 ENCOUNTER — Encounter: Payer: Self-pay | Admitting: Adult Health

## 2021-05-09 ENCOUNTER — Encounter: Payer: Medicaid Other | Admitting: Adult Health

## 2021-05-09 ENCOUNTER — Other Ambulatory Visit: Payer: Medicaid Other

## 2021-05-09 DIAGNOSIS — Z30013 Encounter for initial prescription of injectable contraceptive: Secondary | ICD-10-CM

## 2021-05-10 LAB — BETA HCG QUANT (REF LAB): hCG Quant: 2 m[IU]/mL

## 2021-05-10 NOTE — Progress Notes (Signed)
Pt not seen. Rescheduled.This encounter was created in error - please disregard. ?

## 2021-05-14 ENCOUNTER — Other Ambulatory Visit: Payer: Medicaid Other | Admitting: Adult Health

## 2021-06-20 ENCOUNTER — Other Ambulatory Visit: Payer: Medicaid Other | Admitting: Adult Health

## 2021-07-15 ENCOUNTER — Other Ambulatory Visit: Payer: Medicaid Other | Admitting: Adult Health

## 2021-10-31 ENCOUNTER — Encounter: Payer: Self-pay | Admitting: Physician Assistant

## 2021-10-31 ENCOUNTER — Other Ambulatory Visit (HOSPITAL_COMMUNITY)
Admission: RE | Admit: 2021-10-31 | Discharge: 2021-10-31 | Disposition: A | Discharge status: Home / Self Care | Payer: Self-pay | Source: Ambulatory Visit | Attending: Physician Assistant | Admitting: Physician Assistant

## 2021-10-31 ENCOUNTER — Ambulatory Visit: Payer: Self-pay | Admitting: Physician Assistant

## 2021-10-31 ENCOUNTER — Ambulatory Visit (HOSPITAL_COMMUNITY)
Admission: RE | Admit: 2021-10-31 | Discharge: 2021-10-31 | Disposition: A | Discharge status: Home / Self Care | Payer: Self-pay | Source: Ambulatory Visit | Attending: Physician Assistant | Admitting: Physician Assistant

## 2021-10-31 VITALS — BP 176/113 | HR 88 | Temp 97.8°F | Ht 61.0 in | Wt 171.0 lb

## 2021-10-31 DIAGNOSIS — R079 Chest pain, unspecified: Secondary | ICD-10-CM | POA: Insufficient documentation

## 2021-10-31 DIAGNOSIS — I10 Essential (primary) hypertension: Secondary | ICD-10-CM

## 2021-10-31 DIAGNOSIS — Z13220 Encounter for screening for lipoid disorders: Secondary | ICD-10-CM | POA: Insufficient documentation

## 2021-10-31 DIAGNOSIS — Z5320 Procedure and treatment not carried out because of patient's decision for unspecified reasons: Secondary | ICD-10-CM

## 2021-10-31 DIAGNOSIS — Z7689 Persons encountering health services in other specified circumstances: Secondary | ICD-10-CM

## 2021-10-31 DIAGNOSIS — M940 Chondrocostal junction syndrome [Tietze]: Secondary | ICD-10-CM

## 2021-10-31 DIAGNOSIS — R059 Cough, unspecified: Secondary | ICD-10-CM

## 2021-10-31 DIAGNOSIS — F17200 Nicotine dependence, unspecified, uncomplicated: Secondary | ICD-10-CM | POA: Insufficient documentation

## 2021-10-31 DIAGNOSIS — Z131 Encounter for screening for diabetes mellitus: Secondary | ICD-10-CM | POA: Insufficient documentation

## 2021-10-31 DIAGNOSIS — E669 Obesity, unspecified: Secondary | ICD-10-CM

## 2021-10-31 LAB — CBC WITH DIFFERENTIAL/PLATELET
Abs Immature Granulocytes: 0.04 10*3/uL (ref 0.00–0.07)
Basophils Absolute: 0.1 10*3/uL (ref 0.0–0.1)
Basophils Relative: 1 %
Eosinophils Absolute: 0.2 10*3/uL (ref 0.0–0.5)
Eosinophils Relative: 2 %
HCT: 46.9 % — ABNORMAL HIGH (ref 36.0–46.0)
Hemoglobin: 15.7 g/dL — ABNORMAL HIGH (ref 12.0–15.0)
Immature Granulocytes: 0 %
Lymphocytes Relative: 22 %
Lymphs Abs: 2.5 10*3/uL (ref 0.7–4.0)
MCH: 30.8 pg (ref 26.0–34.0)
MCHC: 33.5 g/dL (ref 30.0–36.0)
MCV: 92.1 fL (ref 80.0–100.0)
Monocytes Absolute: 1 10*3/uL (ref 0.1–1.0)
Monocytes Relative: 9 %
Neutro Abs: 7.6 10*3/uL (ref 1.7–7.7)
Neutrophils Relative %: 66 %
Platelets: 392 10*3/uL (ref 150–400)
RBC: 5.09 MIL/uL (ref 3.87–5.11)
RDW: 12.3 % (ref 11.5–15.5)
WBC: 11.4 10*3/uL — ABNORMAL HIGH (ref 4.0–10.5)
nRBC: 0 % (ref 0.0–0.2)

## 2021-10-31 LAB — LIPID PANEL
Cholesterol: 218 mg/dL — ABNORMAL HIGH (ref 0–200)
HDL: 38 mg/dL — ABNORMAL LOW (ref >40)
LDL Cholesterol: 129 mg/dL — ABNORMAL HIGH (ref 0–99)
Total CHOL/HDL Ratio: 5.7 RATIO
Triglycerides: 256 mg/dL — ABNORMAL HIGH (ref <150)
VLDL: 51 mg/dL — ABNORMAL HIGH (ref 0–40)

## 2021-10-31 LAB — COMPREHENSIVE METABOLIC PANEL
ALT: 18 U/L (ref 0–44)
AST: 15 U/L (ref 15–41)
Albumin: 4 g/dL (ref 3.5–5.0)
Alkaline Phosphatase: 138 U/L — ABNORMAL HIGH (ref 38–126)
Anion gap: 7 (ref 5–15)
BUN: 11 mg/dL (ref 6–20)
CO2: 21 mmol/L — ABNORMAL LOW (ref 22–32)
Calcium: 8.8 mg/dL — ABNORMAL LOW (ref 8.9–10.3)
Chloride: 110 mmol/L (ref 98–111)
Creatinine, Ser: 0.77 mg/dL (ref 0.44–1.00)
GFR, Estimated: >60 mL/min (ref >60)
Glucose, Bld: 116 mg/dL — ABNORMAL HIGH (ref 70–99)
Potassium: 3.3 mmol/L — ABNORMAL LOW (ref 3.5–5.1)
Sodium: 138 mmol/L (ref 135–145)
Total Bilirubin: 1 mg/dL (ref 0.3–1.2)
Total Protein: 7.4 g/dL (ref 6.5–8.1)

## 2021-10-31 LAB — HEMOGLOBIN A1C
Hgb A1c MFr Bld: 5.4 % (ref 4.8–5.6)
Mean Plasma Glucose: 108.28 mg/dL

## 2021-10-31 MED ORDER — LOSARTAN POTASSIUM 100 MG PO TABS: 100.0000 mg | ORAL_TABLET | Freq: Every day | ORAL | 0 refills | Status: DC

## 2021-10-31 MED ORDER — ALBUTEROL SULFATE HFA 108 (90 BASE) MCG/ACT IN AERS: 2.0000 | INHALATION_SPRAY | Freq: Four times a day (QID) | RESPIRATORY_TRACT | 0 refills | Status: AC | PRN

## 2021-10-31 MED ORDER — ALBUTEROL SULFATE HFA 108 (90 BASE) MCG/ACT IN AERS: 2.0000 | INHALATION_SPRAY | Freq: Four times a day (QID) | RESPIRATORY_TRACT | 2 refills | Status: DC | PRN

## 2021-10-31 MED ORDER — CLONIDINE HCL 0.1 MG PO TABS
0.1000 mg | ORAL_TABLET | Freq: Once | ORAL | Status: AC
  Administered 2021-10-31: 0.1 mg via ORAL

## 2021-10-31 MED ORDER — LOSARTAN POTASSIUM 100 MG PO TABS: 100.0000 mg | ORAL_TABLET | Freq: Every day | ORAL | 0 refills | Status: AC

## 2021-10-31 NOTE — Patient Instructions (Signed)
Costochondritis  Costochondritis is inflammation of the tissue (cartilage) that connects the ribs to the breastbone (sternum). This causes pain in the front of the chest. The pain usually starts slowly and involves more than one rib. What are the causes? The exact cause of this condition is not always known. It results from stress on the cartilage where your ribs attach to your sternum. The cause of this stress could be: Chest injury. Exercise or activity, such as lifting. Severe coughing. What increases the risk? You are more likely to develop this condition if you: Are female. Are 30-40 years old. Recently started a new exercise or work activity. Have low levels of vitamin D. Have a condition that makes you cough frequently. What are the signs or symptoms? The main symptom of this condition is chest pain. The pain: Usually starts gradually and can be sharp or dull. Gets worse with deep breathing, coughing, or exercise. Gets better with rest. May be worse when you press on the affected area of your ribs and sternum. How is this diagnosed? This condition is diagnosed based on your symptoms, your medical history, and a physical exam. Your health care provider will check for pain when pressing on your sternum. You may also have tests to rule out other causes of chest pain. These may include: A chest X-ray to check for lung problems. An ECG (electrocardiogram) to see if you have a heart problem that could be causing the pain. An imaging scan to rule out a chest or rib fracture. How is this treated? This condition usually goes away on its own over time. Your health care provider may prescribe an NSAID, such as ibuprofen, to reduce pain and inflammation. Treatment may also include: Resting and avoiding activities that make pain worse. Applying heat or ice to the area to reduce pain and inflammation. Doing exercises to stretch your chest muscles. If these treatments do not help, your health  care provider may inject a numbing medicine at the sternum-rib connection to help relieve the pain. Follow these instructions at home: Managing pain, stiffness, and swelling     If directed, put ice on the painful area. To do this: Put ice in a plastic bag. Place a towel between your skin and the bag. Leave the ice on for 20 minutes, 2-3 times a day. If directed, apply heat to the affected area as often as told by your health care provider. Use the heat source that your health care provider recommends, such as a moist heat pack or a heating pad. Place a towel between your skin and the heat source. Leave the heat on for 20-30 minutes. Remove the heat if your skin turns bright red. This is especially important if you are unable to feel pain, heat, or cold. You may have a greater risk of getting burned. Activity Rest as told by your health care provider. Avoid activities that make pain worse. This includes any activities that use chest, abdominal, and side muscles. Do not lift anything that is heavier than 10 lb (4.5 kg), or the limit that you are told, until your health care provider says that it is safe. Return to your normal activities as told by your health care provider. Ask your health care provider what activities are safe for you. General instructions Take over-the-counter and prescription medicines only as told by your health care provider. Keep all follow-up visits as told by your health care provider. This is important. Contact a health care provider if: You have chills   or a fever. Your pain does not go away or it gets worse. You have a cough that does not go away. Get help right away if: You have shortness of breath. You have severe chest pain that is not relieved by medicines, heat, or ice. These symptoms may represent a serious problem that is an emergency. Do not wait to see if the symptoms will go away. Get medical help right away. Call your local emergency services (911 in  the [place] Do not drive yourself to the hospital.  Summary Costochondritis is inflammation of the tissue (cartilage) that connects the ribs to the breastbone (sternum). This condition causes pain in the front of the chest. Costochondritis results from stress on the cartilage where your ribs attach to your sternum. Treatment may include medicines, rest, heat or ice, and exercises. This information is not intended to replace advice given to you by your health care provider. Make sure you discuss any questions you have with your health care provider. Document Revised: 04/16/2021 Document Reviewed: 12/10/2018 Elsevier Patient Education  2023 [employer]  

## 2021-10-31 NOTE — Progress Notes (Signed)
BP (!) 181/112   Pulse 88   Temp 97.8 F (36.6 C)   Ht 5\' 1"  (1.549 m)   Wt 171 lb (77.6 kg)   SpO2 98%   BMI 32.31 kg/m    Subjective:    Patient ID: [name], female    DOB: June 08, 1970, 51 y.o.   MRN: [id number]  HPI: [name] is a 51 y.o. female presenting on 10/31/2021 for New Patient (Initial Visit)   HPI  Chief Complaint  Patient presents with   New Patient (Initial Visit)    Pt is 51yo smoker with history of HTN and hypokalemia who presents to establish care.   She C/o cough and CP x 2 week.  She got covid vaccination but doesn't have her card with her.   She has been Off depo since November.   She doesn't work.  She has had No fevers.  Sometimes feels hot, sometimes cold- wonders if it's menopause coming.  She says her CP is constant and hurts more when breaths deep.   She has some wheezing.   She used inhaler in the past and says she would use one now if she had one.  She does not feel sick.   She says she has Never had a mammogram-   Her Last pap 2019-    She Denies anxiety/depression  She used inhaler years ago.    She has no history of cardiac issues or stroke.     Relevant past medical, surgical, family and social history reviewed and updated as indicated. Interim medical history since our last visit reviewed. Allergies and medications reviewed and updated.     No current outpatient medications on file.    Review of Systems  Per HPI unless specifically indicated above     Objective:    BP (!) 181/112   Pulse 88   Temp 97.8 F (36.6 C)   Ht 5\' 1"  (1.549 m)   Wt 171 lb (77.6 kg)   SpO2 98%   BMI 32.31 kg/m   Wt Readings from Last 3 Encounters:  10/31/21 171 lb (77.6 kg)  03/23/19 163 lb (73.9 kg)  03/15/19 163 lb 3.2 oz (74 kg)    Physical Exam Vitals reviewed.  Constitutional:      General: She is not in acute distress.    Appearance: She is well-developed. She is obese. She is not toxic-appearing.  HENT:     Head:  Normocephalic and atraumatic.     Right Ear: Tympanic membrane, ear canal and external ear normal.     Left Ear: Tympanic membrane, ear canal and external ear normal.  Eyes:     Extraocular Movements: Extraocular movements intact.     Conjunctiva/sclera: Conjunctivae normal.     Pupils: Pupils are equal, round, and reactive to light.  Cardiovascular:     Rate and Rhythm: Normal rate and regular rhythm.     Heart sounds: No murmur heard. Pulmonary:     Effort: Pulmonary effort is normal.     Breath sounds: Normal breath sounds.  Chest:     Chest wall: Tenderness present. No mass, deformity, swelling, crepitus or edema.     Comments: Tenderness right upper chest wall.  No point tenderness.  No bony tenderness.    Abdominal:     General: Bowel sounds are normal.     Palpations: Abdomen is soft. There is no mass.     Tenderness: There is no abdominal tenderness.  Musculoskeletal:  Cervical back: Neck supple.     Right lower leg: No edema.     Left lower leg: No edema.  Lymphadenopathy:     Cervical: No cervical adenopathy.  Skin:    General: Skin is warm and dry.     Comments: Nails bitten very low all fingers.  No secondary infection  Neurological:     Mental Status: She is alert and oriented to person, place, and time.  Psychiatric:        Attention and Perception: Attention normal.        Speech: Speech normal.        Behavior: Behavior normal. Behavior is cooperative.       Clonidine 0.1mg  given at 9:57 am  EKG- NSR.  No st-t changes.  No changes compared with EKG done 10/24/2014     Assessment & Plan:     Encounter Diagnoses  Name Primary?   Encounter to establish care Yes   Primary hypertension    Chest pain, unspecified type    Tobacco use disorder    Cough, unspecified type    Costochondritis    Obesity, unspecified classification, unspecified obesity type, unspecified whether serious comorbidity present    Screening cholesterol level    Screening for  diabetes mellitus    Screening mammography declined      -Costochondritis likely.  Discussed with pt and she was given reading information -Check cxr today -check Baseline labs today -rx Losartan 100mg - pt urged to START it TODAY -rx Albuterol mdi -Pt declines screening mammogram -She has [place] medicaid- she needs to call her gyn to get appointment to update PAP and get back on the depo. -Will discuss insomnia at future appt -pt is given [facility]/ application for [employer] financial assistance -pt to follow up 2 weeks.  She is to contact office sooner prn

## 2021-11-04 ENCOUNTER — Telehealth: Payer: Self-pay

## 2021-11-04 NOTE — Telephone Encounter (Signed)
Attempted call to follow up. No answer, left voicemail requesting a return call.   Client has appt at [facility] for [place] so if no call back will plan to follow up in person tomorrow.   [place] [employer]

## 2021-11-14 ENCOUNTER — Ambulatory Visit: Payer: Self-pay | Admitting: Physician Assistant

## 2021-11-18 ENCOUNTER — Encounter: Payer: Self-pay | Admitting: Physician Assistant

## 2021-11-18 ENCOUNTER — Ambulatory Visit: Payer: Self-pay | Admitting: Physician Assistant

## 2021-11-18 VITALS — BP 178/101 | HR 84 | Temp 98.0°F

## 2021-11-18 DIAGNOSIS — Z1211 Encounter for screening for malignant neoplasm of colon: Secondary | ICD-10-CM

## 2021-11-18 DIAGNOSIS — I10 Essential (primary) hypertension: Secondary | ICD-10-CM

## 2021-11-18 DIAGNOSIS — E785 Hyperlipidemia, unspecified: Secondary | ICD-10-CM

## 2021-11-18 DIAGNOSIS — F17200 Nicotine dependence, unspecified, uncomplicated: Secondary | ICD-10-CM

## 2021-11-18 DIAGNOSIS — Z5320 Procedure and treatment not carried out because of patient's decision for unspecified reasons: Secondary | ICD-10-CM

## 2021-11-18 DIAGNOSIS — R7989 Other specified abnormal findings of blood chemistry: Secondary | ICD-10-CM

## 2021-11-18 MED ORDER — AMLODIPINE BESYLATE 10 MG PO TABS: 10.0000 mg | ORAL_TABLET | Freq: Every day | ORAL | 0 refills | Status: AC

## 2021-11-18 NOTE — Progress Notes (Signed)
BP (!) 178/101   Pulse 84   Temp 98 F (36.7 C)   SpO2 97%    Subjective:    Patient ID: [name], female    DOB: 01-23-1971, 51 y.o.   MRN: [id number]  HPI: [name] is a 51 y.o. female presenting on 11/18/2021 for No chief complaint on file.   HPI   Pt is [name] who presents for follow up of new patient appointment last month.    She says she is taking her medication every morning; she did not bring her meds with her today.     Relevant past medical, surgical, family and social history reviewed and updated as indicated. Interim medical history since our last visit reviewed. Allergies and medications reviewed and updated.   Current Outpatient Medications:    albuterol (VENTOLIN HFA) 108 (90 Base) MCG/ACT inhaler, Inhale 2 puffs into the lungs every 6 (six) hours as needed for wheezing or shortness of breath., Disp: 3 each, Rfl: 0   losartan (COZAAR) 100 MG tablet, Take 1 tablet (100 mg total) by mouth daily., Disp: 90 tablet, Rfl: 0   Review of Systems  Per HPI unless specifically indicated above     Objective:    BP (!) 178/101   Pulse 84   Temp 98 F (36.7 C)   SpO2 97%   Wt Readings from Last 3 Encounters:  10/31/21 171 lb (77.6 kg)  03/23/19 163 lb (73.9 kg)  03/15/19 163 lb 3.2 oz (74 kg)    Physical Exam Vitals reviewed.  Constitutional:      General: She is not in acute distress.    Appearance: She is well-developed. She is not toxic-appearing.     Comments: Disheveled.  Odor.   HENT:     Head: Normocephalic and atraumatic.  Cardiovascular:     Rate and Rhythm: Normal rate and regular rhythm.  Pulmonary:     Effort: Pulmonary effort is normal.     Breath sounds: Normal breath sounds.  Abdominal:     General: Bowel sounds are normal.     Palpations: Abdomen is soft. There is no mass.     Tenderness: There is no abdominal tenderness.  Musculoskeletal:     Cervical back: Neck supple.     Right lower leg: No edema.     Left lower  leg: No edema.  Lymphadenopathy:     Cervical: No cervical adenopathy.  Skin:    General: Skin is warm and dry.  Neurological:     Mental Status: She is alert and oriented to person, place, and time.  Psychiatric:        Behavior: Behavior normal.     Results for orders placed or performed during the hospital encounter of 10/31/21  CBC w/Diff/Platelet  Result Value Ref Range   WBC 11.4 (H) 4.0 - 10.5 K/uL   RBC 5.09 3.87 - 5.11 MIL/uL   Hemoglobin 15.7 (H) 12.0 - 15.0 g/dL   HCT 46.9 (H) 36.0 - 46.0 %   MCV 92.1 80.0 - 100.0 fL   MCH 30.8 26.0 - 34.0 pg   MCHC 33.5 30.0 - 36.0 g/dL   RDW 12.3 11.5 - 15.5 %   Platelets 392 150 - 400 K/uL   nRBC 0.0 0.0 - 0.2 %   Neutrophils Relative % 66 %   Neutro Abs 7.6 1.7 - 7.7 K/uL   Lymphocytes Relative 22 %   Lymphs Abs 2.5 0.7 - 4.0 K/uL   Monocytes Relative 9 %  Monocytes Absolute 1.0 0.1 - 1.0 K/uL   Eosinophils Relative 2 %   Eosinophils Absolute 0.2 0.0 - 0.5 K/uL   Basophils Relative 1 %   Basophils Absolute 0.1 0.0 - 0.1 K/uL   Immature Granulocytes 0 %   Abs Immature Granulocytes 0.04 0.00 - 0.07 K/uL  Hemoglobin A1c  Result Value Ref Range   Hgb A1c MFr Bld 5.4 4.8 - 5.6 %   Mean Plasma Glucose 108.28 mg/dL  Lipid panel  Result Value Ref Range   Cholesterol 218 (H) 0 - 200 mg/dL   Triglycerides 256 (H) <150 mg/dL   HDL 38 (L) >40 mg/dL   Total CHOL/HDL Ratio 5.7 RATIO   VLDL 51 (H) 0 - 40 mg/dL   LDL Cholesterol 129 (H) 0 - 99 mg/dL  Comprehensive metabolic panel  Result Value Ref Range   Sodium 138 135 - 145 mmol/L   Potassium 3.3 (L) 3.5 - 5.1 mmol/L   Chloride 110 98 - 111 mmol/L   CO2 21 (L) 22 - 32 mmol/L   Glucose, Bld 116 (H) 70 - 99 mg/dL   BUN 11 6 - 20 mg/dL   Creatinine, Ser 0.77 0.44 - 1.00 mg/dL   Calcium 8.8 (L) 8.9 - 10.3 mg/dL   Total Protein 7.4 6.5 - 8.1 g/dL   Albumin 4.0 3.5 - 5.0 g/dL   AST 15 15 - 41 U/L   ALT 18 0 - 44 U/L   Alkaline Phosphatase 138 (H) 38 - 126 U/L   Total  Bilirubin 1.0 0.3 - 1.2 mg/dL   GFR, Estimated >60 >60 mL/min   Anion gap 7 5 - 15      Assessment & Plan:   Encounter Diagnoses  Name Primary?   Primary hypertension Yes   Tobacco use disorder    Screening mammography declined    Screening for colon cancer    Hyperlipidemia, unspecified hyperlipidemia type    Elevated LFTs      HTN -Reviewed labs with pt -continue losartan and add amlodipine -encouraged healthy Diet and regular exercise for HTN -pt to follow up in 3 weeks to recheck bp.    Dyslipidemia -counseled on lowfat diet and gave handouth.   -encouraged healthy Diet and regular exercise for dyslipidemia  Elevated LFTs-  -mild elevation in Alk Phos.  Will monitor  Tobacco use disorder -reviewed  CXR results with pt -counseled smoking cessation  HCM -pt says she got covid vax but didn't bring her card -pt Declined screening  mammogram -She is reminded to call her gyn to update PAP ([employer] medicaid) -pt is given FIT test for colon cancer screening  Pt to contact office if needed prior to her appointment

## 2021-11-18 NOTE — Patient Instructions (Signed)

## 2021-11-19 ENCOUNTER — Telehealth: Payer: Self-pay | Admitting: Student

## 2021-11-19 NOTE — Telephone Encounter (Signed)
Pt called c/o sharp pain on R breast that started yesterday evening with some SOB.  Pt was at [facility] yesterday with BP of 178/101.  Due to pt's symptoms and uncontrolled elevated bp, pt was urged to go to ER for evaulation. Pt verbalized understanding.

## 2021-12-10 ENCOUNTER — Encounter: Payer: Self-pay | Admitting: Physician Assistant

## 2021-12-10 ENCOUNTER — Ambulatory Visit: Payer: Self-pay | Admitting: Physician Assistant

## 2021-12-10 VITALS — BP 140/94 | HR 112 | Temp 98.2°F | Ht 61.0 in | Wt 169.1 lb

## 2021-12-10 DIAGNOSIS — K029 Dental caries, unspecified: Secondary | ICD-10-CM

## 2021-12-10 DIAGNOSIS — I10 Essential (primary) hypertension: Secondary | ICD-10-CM

## 2021-12-10 DIAGNOSIS — K0889 Other specified disorders of teeth and supporting structures: Secondary | ICD-10-CM

## 2021-12-10 MED ORDER — AMOXICILLIN-POT CLAVULANATE 875-125 MG PO TABS: 1.0000 | ORAL_TABLET | Freq: Two times a day (BID) | ORAL | 0 refills | Status: DC

## 2021-12-10 NOTE — Progress Notes (Signed)
BP (!) 140/94   Pulse (!) 112   Temp 98.2 F (36.8 C)   Ht 5\' 1"  (1.549 m)   Wt 169 lb 1.6 oz (76.7 kg)   SpO2 99%   BMI 31.95 kg/m    Subjective:    Patient ID: [name], female    DOB: 08/06/1970, 51 y.o.   MRN: [id number]  HPI: [name] is a 51 y.o. female presenting on 12/10/2021 for Hypertension and Dental Pain   HPI   Chief Complaint  Patient presents with   Hypertension   Dental Pain    She has been taking a lot of goody powders.  Tooth started hurting Saturday.  It is shooting pain and whole jaw hurts    Relevant past medical, surgical, family and social history reviewed and updated as indicated. Interim medical history since our last visit reviewed. Allergies and medications reviewed and updated.    Current Outpatient Medications:    albuterol (VENTOLIN HFA) 108 (90 Base) MCG/ACT inhaler, Inhale 2 puffs into the lungs every 6 (six) hours as needed for wheezing or shortness of breath., Disp: 3 each, Rfl: 0   amLODipine (NORVASC) 10 MG tablet, Take 1 tablet (10 mg total) by mouth daily., Disp: 90 tablet, Rfl: 0   losartan (COZAAR) 100 MG tablet, Take 1 tablet (100 mg total) by mouth daily., Disp: 90 tablet, Rfl: 0    Review of Systems  Per HPI unless specifically indicated above     Objective:    BP (!) 140/94   Pulse (!) 112   Temp 98.2 F (36.8 C)   Ht 5\' 1"  (1.549 m)   Wt 169 lb 1.6 oz (76.7 kg)   SpO2 99%   BMI 31.95 kg/m   Wt Readings from Last 3 Encounters:  12/10/21 169 lb 1.6 oz (76.7 kg)  10/31/21 171 lb (77.6 kg)  03/23/19 163 lb (73.9 kg)    Physical Exam Constitutional:      General: She is not in acute distress.    Appearance: She is not toxic-appearing.  HENT:     Head: Normocephalic and atraumatic.     Jaw: No trismus, tenderness or swelling.     Comments: No swelling of the face    Mouth/Throat:     Dentition: Abnormal dentition. Dental tenderness and dental caries present.     Comments: Left upper molar  tooth broken or decayed.   tender surrounding gums inflamed Cardiovascular:     Rate and Rhythm: Regular rhythm. Tachycardia present.  Pulmonary:     Effort: Pulmonary effort is normal. No respiratory distress.     Breath sounds: Normal breath sounds. No wheezing or rhonchi.  Musculoskeletal:     Right lower leg: No edema.     Left lower leg: No edema.  Skin:    General: Skin is warm and dry.  Neurological:     Mental Status: She is alert and oriented to person, place, and time.  Psychiatric:        Behavior: Behavior normal.           Assessment & Plan:   Encounter Diagnoses  Name Primary?   Primary hypertension Yes   Dentalgia    Dental decay      -augmentin for tooth and send to dentist -no changes to bp meds today.  Bp improved and my improve more with resolution of toothache -pt counseled to not use the goody powder more than the packaging states.  She can use APAP  or IBU in lieu.   -pt to follow up 1 month to recheck the bp.  Pt to contact office sooner prn

## 2021-12-11 ENCOUNTER — Telehealth: Payer: Self-pay

## 2021-12-12 ENCOUNTER — Ambulatory Visit: Payer: Self-pay | Admitting: Physician Assistant

## 2021-12-16 ENCOUNTER — Other Ambulatory Visit: Payer: Self-pay | Admitting: Physician Assistant

## 2021-12-16 DIAGNOSIS — I10 Essential (primary) hypertension: Secondary | ICD-10-CM

## 2021-12-16 DIAGNOSIS — E876 Hypokalemia: Secondary | ICD-10-CM

## 2022-01-09 ENCOUNTER — Encounter: Payer: Self-pay | Admitting: Physician Assistant

## 2022-01-09 ENCOUNTER — Ambulatory Visit: Payer: Self-pay | Admitting: Physician Assistant

## 2022-01-09 DIAGNOSIS — J069 Acute upper respiratory infection, unspecified: Secondary | ICD-10-CM

## 2022-01-09 MED ORDER — BENZONATATE 100 MG PO CAPS: ORAL_CAPSULE | ORAL | 3 refills | Status: DC

## 2022-01-09 NOTE — Progress Notes (Signed)
   There were no vitals taken for this visit.   Subjective:    Patient ID: [name], female    DOB: 1970-03-20, 51 y.o.   MRN: [id number]  HPI: [name] is a 51 y.o. female presenting on 01/09/2022 for No chief complaint on file.   HPI  This is a telemedicine visit via [employer]  I connected with  [name] on 01/09/22 by a video enabled telemedicine application and verified that I am speaking with the correct person using two identifiers.   I discussed the limitations of evaluation and management by telemedicine. The patient expressed understanding and agreed to proceed.  Pt is at home.  Provider is at office.   Pt has been Sick since thanksgiving.  At a family gathering, she had a  Niece and a nephew and another child who were all sick.  Pt awakened on Saturday Feeling bad.  She has Coughing and congestion, ST, some ear pressure.  Some sob at times but inhaler helps.  She didn't check for fever but she doesn't think so.  She just quit smoking about a month ago.  She went to dentist since she was seen here october 31.  She did a covid test yesterday that was negative.  She is using some mucinex which Helps with congestion but not cough.      Relevant past medical, surgical, family and social history reviewed and updated as indicated. Interim medical history since our last visit reviewed. Allergies and medications reviewed and updated.   Current Outpatient Medications:    albuterol (VENTOLIN HFA) 108 (90 Base) MCG/ACT inhaler, Inhale 2 puffs into the lungs every 6 (six) hours as needed for wheezing or shortness of breath., Disp: 3 each, Rfl: 0   amLODipine (NORVASC) 10 MG tablet, Take 1 tablet (10 mg total) by mouth daily., Disp: 90 tablet, Rfl: 0   losartan (COZAAR) 100 MG tablet, Take 1 tablet (100 mg total) by mouth daily., Disp: 90 tablet, Rfl: 0     Review of Systems  Per HPI unless specifically indicated above     Objective:    There were no vitals  taken for this visit.  Wt Readings from Last 3 Encounters:  12/10/21 169 lb 1.6 oz (76.7 kg)  10/31/21 171 lb (77.6 kg)  03/23/19 163 lb (73.9 kg)    Physical Exam Constitutional:      General: She is not in acute distress.    Appearance: She is not toxic-appearing.  HENT:     Head: Normocephalic and atraumatic.     Nose: Congestion present.  Pulmonary:     Effort: Pulmonary effort is normal. No respiratory distress.     Comments: Pt is talking in complete sentences without sob.  She coughs several times during her appointment; not intractable.   Neurological:     Mental Status: She is alert and oriented to person, place, and time.           Assessment & Plan:    Encounter Diagnosis  Name Primary?   Upper respiratory tract infection, unspecified type Yes     -rest, fluids, warm salt water gargles, OTCs prn including APAP or IBU, mucinex.   She has albuterol inhaler to use prn.  Rx tessalon sent to  [facility] [place] -she is scheduled to RTO in 2 weeks to check bp.  She is to contact office sooner prn worsening, new symptoms, other issues

## 2022-01-23 ENCOUNTER — Ambulatory Visit: Payer: Self-pay | Admitting: Physician Assistant

## 2022-09-05 ENCOUNTER — Ambulatory Visit: Payer: Medicaid Other | Admitting: Orthopedic Surgery

## 2022-09-25 ENCOUNTER — Other Ambulatory Visit: Payer: Self-pay

## 2022-09-25 ENCOUNTER — Ambulatory Visit: Payer: Medicaid Other | Admitting: Orthopedic Surgery

## 2022-09-25 ENCOUNTER — Encounter: Payer: Self-pay | Admitting: Orthopedic Surgery

## 2022-09-25 VITALS — BP 122/90 | HR 96 | Ht 61.0 in | Wt 166.0 lb

## 2022-09-25 DIAGNOSIS — M25572 Pain in left ankle and joints of left foot: Secondary | ICD-10-CM

## 2022-09-25 DIAGNOSIS — T8484XA Pain due to internal orthopedic prosthetic devices, implants and grafts, initial encounter: Secondary | ICD-10-CM

## 2022-09-25 DIAGNOSIS — Z01818 Encounter for other preprocedural examination: Secondary | ICD-10-CM

## 2022-09-25 NOTE — Progress Notes (Signed)
Chief Complaint  Patient presents with   Ankle Pain    Left since 2015/ wearing flip flops today    Social services   Patient: [name]           Date of Birth: 29-Sep-1970           MRN: [id number] Visit Date: 09/25/2022 Requested by: Wilmon Pali, FNP [address] [place],  [place] [address] PCP: Wilmon Pali, FNP    Chief Complaint  Patient presents with   Ankle Pain    Left since 2015/ wearing flip flops today     [name] is 52 years old she had a complicated approximate the 8 or 9 years ago complicated by flexion contracture and RSD.  She became disabled  She comes in today with requirements Medicaid and disability to be seen by an orthopedist for reevaluation.  A month ago she had excessive swelling after walking for a long time.  Her doctor sent her to urgent care they would not see her and said she needed to see the orthopedist who did the surgery so she is here for the reason as well  She still has the RSD symptoms with numbness and tingling on the dorsum of the foot the lateral 3 digits of the foot are numb he has limited range of motion plantigrade foot  Index procedure was  09/28/2013 ORIF trimalleolar ankle fracture with syndesmotic screw fixation no 11/09/2013 I&D lateral side for wound drainage December 28, 2013 had removal of syndesmosis screw and Achilles tendon Z-lengthening 10/25/2014 stitch abscess      Body mass index is 31.37 kg/m.   Problem list, medical hx, medications and allergies reviewed   Review of Systems  Neurological:  Positive for tingling, sensory change and weakness.     Allergies  Allergen Reactions   Clindamycin/Lincomycin Itching    Rash with itching   Vancomycin     Possible hives ???   Wellbutrin [Bupropion] Hives    BP (!) 122/90   Pulse 96   Ht 5\' 1"  (1.549 m)   Wt 166 lb (75.3 kg)   BMI 31.37 kg/m    Physical exam: Physical Exam  General: Normal appearance  CDV: Normal pulses in both feet cool  left foot  MS: Awake alert oriented  PSYCH: Normal  NEURO: Hypersensitivity dorsum of foot decreased sensation third fourth and fifth digit of the foot  MSK: Ankle, left ankle  Plantigrade left foot 5 degrees dorsiflexion 10 plantarflexion normal subtalar motion no tenderness in the joint line prominent pains medially normal lateral incision except for scarring no prominence of hardware  Data reviewed:   Prior operative notes and today's x-rays with the marked the pins are prominent arthritic to the skin  Assessment and plan:  Encounter Diagnosis  Name Primary?   Pain in left ankle and joints of left foot Yes    Recommend hardware removal medial pins  Patient disabled cannot walk or stand for any length of time  Anticipate speaking with Social Security disability to discuss what type of No orders of the defined types were placed in this encounter.   Procedures:   Scheduled hardware removal of the left ankle

## 2022-09-25 NOTE — Patient Instructions (Addendum)
Your surgery will be at [facility] by Dr Romeo Apple  The hospital will contact you with a preoperative appointment to discuss Anesthesia.  Please arrive on time or 15 minutes early for the preoperative appointment, they have a very tight schedule if you are late or do not come in your surgery will be cancelled.  The phone number is [phone]. Please bring your medications with you for the appointment. They will tell you the arrival time and medication instructions when you have your preoperative evaluation. Do not wear nail polish the day of your surgery and if you take Phentermine you need to stop this medication ONE WEEK prior to your surgery. If you take Docia Barrier, Jardiance, or Steglatro) - Hold 72 hours before the procedure.  If you take Ozempic,  Mounjaro, Bydureon or Trulicity do not take for 8 days before your surgery. If you take Victoza, Rybelsis, Saxenda or Adlyxi stop 24 hours before the procedure.  Please arrive at the hospital 2 hours before procedure if scheduled at 9:30 or later in the day or at the time the nurse tells you at your preoperative visit.   If you have my chart do not use the time given in my chart use the time given to you by the nurse during your preoperative visit.   Your surgery  time may change. Please be available for phone calls the day of your surgery and the day before. The Short Stay department may need to discuss changes about your surgery time. Not reaching the you could lead to procedure delays and possible cancellation.  You must have a ride home and someone to stay with you for 24 to 48 hours. The person taking you home will receive and sign for the your discharge instructions.  Please be prepared to give your support person's name and telephone number to Central Registration. Dr Romeo Apple will need that name and phone number post procedure.

## 2022-10-08 NOTE — Patient Instructions (Signed)
[name]  10/08/2022     @PREFPERIOPPHARMACY @   Your procedure is scheduled on  10/15/2022.   Report to [facility] at  0600  A.M.   Call this number if you have problems the morning of surgery:  [phone]  If you experience any cold or flu symptoms such as cough, fever, chills, shortness of breath, etc. between now and your scheduled surgery, please notify us at the above number.   Remember:  Do not eat or drink after midnight.        Use your inhaler before you come and bring your rescue inhaler with you.     Take these medicines the morning of surgery with A SIP OF WATER                                              amlodipine.     Do not wear jewelry, make-up or nail polish, including gel polish,  artificial nails, or any other type of covering on natural nails (fingers and  toes).  Do not wear lotions, powders, or perfumes, or deodorant.  Do not shave 48 hours prior to surgery.  Men may shave face and neck.  Do not bring valuables to the hospital.  [facility] is not responsible for any belongings or valuables.  Contacts, dentures or bridgework may not be worn into surgery.  Leave your suitcase in the car.  After surgery it may be brought to your room.  For patients admitted to the hospital, discharge time will be determined by your treatment team.  Patients discharged the day of surgery will not be allowed to drive home and must have someone with them for 24 hours.     Special instructions:   DO NOT smoke tobacco or vape for 24 hours before your procedure.  Please read over the following fact sheets that you were given. Anesthesia Post-op Instructions and Care and Recovery After Surgery      Orthopedic Hardware Removal, Care After The following information offers guidance on how to care for yourself after your procedure. Your health care provider may also give you more specific instructions. If you have problems or questions, contact your  health care provider. What can I expect after the procedure? After the procedure, it is common to have: Soreness or pain. Some redness and swelling in the area where the hardware was removed. A small amount of blood or fluid coming from your incision. Follow these instructions at home: Medicines Take over-the-counter and prescription medicines only as told by your health care provider. Ask your health care provider if the medicine prescribed to you: Requires you to avoid driving or using machinery. Can cause constipation. You may need to take these actions to prevent or treat constipation: Drink enough fluid to keep your urine pale yellow. Take over-the-counter or prescription medicines. Eat foods that are high in fiber, such as beans, whole grains, and fresh fruits and vegetables. Limit foods that are high in fat and processed sugars, such as fried or sweet foods. If you have a nonremovable cast: Do not put pressure on any part of the cast until it is fully hardened. This may take several hours. Do not stick anything inside the cast to scratch your skin. Doing that increases your risk of infection. Check the skin around the cast every day.  Tell your health care provider about any concerns. You may put lotion on dry skin around the edges of the cast. Do not put lotion on the skin underneath the cast. Keep the cast clean and dry. If you have a removable splint or boot: Wear the splint or boot as told by your health care provider. Remove it only as told by your health care provider. Check the skin around the splint or boot every day. Tell your health care provider about any concerns. Loosen the splint or boot if your fingers or toes tingle, become numb, or turn cold and blue. Keep the splint or boot clean and dry. Bathing Do not take baths, swim, or use a hot tub until your health care provider approves. Ask your health care provider if you may take showers. You may only be allowed to take  sponge baths. Keep the bandage (dressing) dry until your health care provider says it can be removed. If the cast, splint, or boot is not waterproof: Do not let it get wet. Cover it with a watertight covering when you take a bath or a shower. Incision care  Follow instructions from your health care provider about how to take care of your incision. Make sure you: Wash your hands with soap and water for at least 20 seconds before and after you change your dressing. If soap and water are not available, use hand sanitizer. Change your dressing as told by your health care provider. Leave stitches (sutures), staples, skin glue, or adhesive strips in place. These skin closures may need to stay in place for 2 weeks or longer. If adhesive strip edges start to loosen and curl up, you may trim the loose edges. Do not remove adhesive strips completely unless your health care provider tells you to do that. Check your incision area every day for signs of infection. Check for: More redness, swelling, or pain. More fluid or blood. Warmth. Pus or a bad smell. Managing pain, stiffness, and swelling  If directed, put ice on the affected area. To do this: If you have a removable splint or boot, remove it as told by your health care provider. Put ice in a plastic bag. Place a towel between your skin and the bag or between your cast and the bag. Leave the ice on for 20 minutes, 2-3 times a day. If your skin turns bright red, remove the ice right away to prevent skin damage. The risk of skin damage is higher if you cannot feel pain, heat, or cold. Move your fingers or toes often to reduce stiffness and swelling. Raise (elevate) the injured area above the level of your heart while you are sitting or lying down. Activity Rest as told by your health care provider. Do not sit for a long time without moving. Get up to take short walks every 1-2 hours. This will improve blood flow and breathing. Ask for help if you  feel weak or unsteady. Do not use the injured limb to support your body weight until your health care provider says that you can. Ask your health care provider when it is safe to drive if you have a cast, splint, or boot on your affected limb. Do exercises as told by your health care provider. Return to your normal activities as told by your health care provider. Ask your health care provider what activities are safe for you. General instructions Do not use any products that contain nicotine or tobacco. These products include cigarettes, chewing tobacco, and  vaping devices, such as e-cigarettes. These can delay bone healing. If you need help quitting, ask your health care provider. Wear compression stockings as told by your health care provider. These stockings help to prevent blood clots and reduce swelling in your legs. Keep all follow-up visits. Your health care provider may need to monitor your healing and check for problems. Contact a health care provider if: You have numbness for more than 24 hours in the area where the hardware was removed. You have signs of infection at your incision area. You have a fever or chills. Your pain is not controlled by medicine. You are unable to do exercises or physical activity as told by your health care provider. Get help right away if: You have trouble breathing. You have chest pain. These symptoms may be an emergency. Get help right away. Call 911. Do not wait to see if the symptoms will go away. Do not drive yourself to the hospital. This information is not intended to replace advice given to you by your health care provider. Make sure you discuss any questions you have with your health care provider. Document Revised: 06/21/2021 Document Reviewed: 06/21/2021 Elsevier Patient Education  2024 [employer] General Anesthesia, Adult, Care After The following information offers guidance on how to care for yourself after your procedure. Your health  care provider may also give you more specific instructions. If you have problems or questions, contact your health care provider. What can I expect after the procedure? After the procedure, it is common for people to: Have pain or discomfort at the IV site. Have nausea or vomiting. Have a sore throat or hoarseness. Have trouble concentrating. Feel cold or chills. Feel weak, sleepy, or tired (fatigue). Have soreness and body aches. These can affect parts of the body that were not involved in surgery. Follow these instructions at home: For the time period you were told by your health care provider:  Rest. Do not participate in activities where you could fall or become injured. Do not drive or use machinery. Do not drink alcohol. Do not take sleeping pills or medicines that cause drowsiness. Do not make important decisions or sign legal documents. Do not take care of children on your own. General instructions Drink enough fluid to keep your urine pale yellow. If you have sleep apnea, surgery and certain medicines can increase your risk for breathing problems. Follow instructions from your health care provider about wearing your sleep device: Anytime you are sleeping, including during daytime naps. While taking prescription pain medicines, sleeping medicines, or medicines that make you drowsy. Return to your normal activities as told by your health care provider. Ask your health care provider what activities are safe for you. Take over-the-counter and prescription medicines only as told by your health care provider. Do not use any products that contain nicotine or tobacco. These products include cigarettes, chewing tobacco, and vaping devices, such as e-cigarettes. These can delay incision healing after surgery. If you need help quitting, ask your health care provider. Contact a health care provider if: You have nausea or vomiting that does not get better with medicine. You vomit every time  you eat or drink. You have pain that does not get better with medicine. You cannot urinate or have bloody urine. You develop a skin rash. You have a fever. Get help right away if: You have trouble breathing. You have chest pain. You vomit blood. These symptoms may be an emergency. Get help right away. Call 911. Do not  wait to see if the symptoms will go away. Do not drive yourself to the hospital. Summary After the procedure, it is common to have a sore throat, hoarseness, nausea, vomiting, or to feel weak, sleepy, or fatigue. For the time period you were told by your health care provider, do not drive or use machinery. Get help right away if you have difficulty breathing, have chest pain, or vomit blood. These symptoms may be an emergency. This information is not intended to replace advice given to you by your health care provider. Make sure you discuss any questions you have with your health care provider. Document Revised: 04/26/2021 Document Reviewed: 04/26/2021 Elsevier Patient Education  2024 [employer]. How to Use Chlorhexidine Before Surgery Chlorhexidine gluconate (CHG) is a germ-killing (antiseptic) solution that is used to clean the skin. It can get rid of the bacteria that normally live on the skin and can keep them away for about 24 hours. To clean your skin with CHG, you may be given: A CHG solution to use in the shower or as part of a sponge bath. A prepackaged cloth that contains CHG. Cleaning your skin with CHG may help lower the risk for infection: While you are staying in the intensive care unit of the hospital. If you have a vascular access, such as a central line, to provide short-term or long-term access to your veins. If you have a catheter to drain urine from your bladder. If you are on a ventilator. A ventilator is a machine that helps you breathe by moving air in and out of your lungs. After surgery. What are the risks? Risks of using CHG include: A skin  reaction. Hearing loss, if CHG gets in your ears and you have a perforated eardrum. Eye injury, if CHG gets in your eyes and is not rinsed out. The CHG product catching fire. Make sure that you avoid smoking and flames after applying CHG to your skin. Do not use CHG: If you have a chlorhexidine allergy or have previously reacted to chlorhexidine. On babies younger than 5 months of age. How to use CHG solution Use CHG only as told by your health care provider, and follow the instructions on the label. Use the full amount of CHG as directed. Usually, this is one bottle. During a shower Follow these steps when using CHG solution during a shower (unless your health care provider gives you different instructions): Start the shower. Use your normal soap and shampoo to wash your face and hair. Turn off the shower or move out of the shower stream. Pour the CHG onto a clean washcloth. Do not use any type of brush or rough-edged sponge. Starting at your neck, lather your body down to your toes. Make sure you follow these instructions: If you will be having surgery, pay special attention to the part of your body where you will be having surgery. Scrub this area for at least 1 minute. Do not use CHG on your head or face. If the solution gets into your ears or eyes, rinse them well with water. Avoid your genital area. Avoid any areas of skin that have broken skin, cuts, or scrapes. Scrub your back and under your arms. Make sure to wash skin folds. Let the lather sit on your skin for 1-2 minutes or as long as told by your health care provider. Thoroughly rinse your entire body in the shower. Make sure that all body creases and crevices are rinsed well. Dry off with a clean towel.  Do not put any substances on your body afterward--such as powder, lotion, or perfume--unless you are told to do so by your health care provider. Only use lotions that are recommended by the manufacturer. Put on clean clothes or  pajamas. If it is the night before your surgery, sleep in clean sheets.  During a sponge bath Follow these steps when using CHG solution during a sponge bath (unless your health care provider gives you different instructions): Use your normal soap and shampoo to wash your face and hair. Pour the CHG onto a clean washcloth. Starting at your neck, lather your body down to your toes. Make sure you follow these instructions: If you will be having surgery, pay special attention to the part of your body where you will be having surgery. Scrub this area for at least 1 minute. Do not use CHG on your head or face. If the solution gets into your ears or eyes, rinse them well with water. Avoid your genital area. Avoid any areas of skin that have broken skin, cuts, or scrapes. Scrub your back and under your arms. Make sure to wash skin folds. Let the lather sit on your skin for 1-2 minutes or as long as told by your health care provider. Using a different clean, wet washcloth, thoroughly rinse your entire body. Make sure that all body creases and crevices are rinsed well. Dry off with a clean towel. Do not put any substances on your body afterward--such as powder, lotion, or perfume--unless you are told to do so by your health care provider. Only use lotions that are recommended by the manufacturer. Put on clean clothes or pajamas. If it is the night before your surgery, sleep in clean sheets. How to use CHG prepackaged cloths Only use CHG cloths as told by your health care provider, and follow the instructions on the label. Use the CHG cloth on clean, dry skin. Do not use the CHG cloth on your head or face unless your health care provider tells you to. When washing with the CHG cloth: Avoid your genital area. Avoid any areas of skin that have broken skin, cuts, or scrapes. Before surgery Follow these steps when using a CHG cloth to clean before surgery (unless your health care provider gives you  different instructions): Using the CHG cloth, vigorously scrub the part of your body where you will be having surgery. Scrub using a back-and-forth motion for 3 minutes. The area on your body should be completely wet with CHG when you are done scrubbing. Do not rinse. Discard the cloth and let the area air-dry. Do not put any substances on the area afterward, such as powder, lotion, or perfume. Put on clean clothes or pajamas. If it is the night before your surgery, sleep in clean sheets.  For general bathing Follow these steps when using CHG cloths for general bathing (unless your health care provider gives you different instructions). Use a separate CHG cloth for each area of your body. Make sure you wash between any folds of skin and between your fingers and toes. Wash your body in the following order, switching to a new cloth after each step: The front of your neck, shoulders, and chest. Both of your arms, under your arms, and your hands. Your stomach and groin area, avoiding the genitals. Your right leg and foot. Your left leg and foot. The back of your neck, your back, and your buttocks. Do not rinse. Discard the cloth and let the area air-dry. Do not  put any substances on your body afterward--such as powder, lotion, or perfume--unless you are told to do so by your health care provider. Only use lotions that are recommended by the manufacturer. Put on clean clothes or pajamas. Contact a health care provider if: Your skin gets irritated after scrubbing. You have questions about using your solution or cloth. You swallow any chlorhexidine. Call your local poison control center [phone] in the [place] Get help right away if: Your eyes itch badly, or they become very red or swollen. Your skin itches badly and is red or swollen. Your hearing changes. You have trouble seeing. You have swelling or tingling in your mouth or throat. You have trouble breathing. These symptoms may  represent a serious problem that is an emergency. Do not wait to see if the symptoms will go away. Get medical help right away. Call your local emergency services (911 in the [place] Do not drive yourself to the hospital. Summary Chlorhexidine gluconate (CHG) is a germ-killing (antiseptic) solution that is used to clean the skin. Cleaning your skin with CHG may help to lower your risk for infection. You may be given CHG to use for bathing. It may be in a bottle or in a prepackaged cloth to use on your skin. Carefully follow your health care provider's instructions and the instructions on the product label. Do not use CHG if you have a chlorhexidine allergy. Contact your health care provider if your skin gets irritated after scrubbing. This information is not intended to replace advice given to you by your health care provider. Make sure you discuss any questions you have with your health care provider. Document Revised: 05/27/2021 Document Reviewed: 04/09/2020 Elsevier Patient Education  2023 [employer].

## 2022-10-09 ENCOUNTER — Encounter (HOSPITAL_COMMUNITY)
Admission: RE | Admit: 2022-10-09 | Discharge: 2022-10-09 | Disposition: A | Discharge status: Home / Self Care | Payer: Medicaid Other | Source: Ambulatory Visit | Attending: Orthopedic Surgery | Admitting: Orthopedic Surgery

## 2022-10-09 VITALS — BP 122/86 | HR 95 | Temp 97.8°F | Resp 18 | Ht 61.0 in | Wt 166.0 lb

## 2022-10-09 DIAGNOSIS — T8484XA Pain due to internal orthopedic prosthetic devices, implants and grafts, initial encounter: Secondary | ICD-10-CM | POA: Insufficient documentation

## 2022-10-09 DIAGNOSIS — E119 Type 2 diabetes mellitus without complications: Secondary | ICD-10-CM | POA: Insufficient documentation

## 2022-10-09 DIAGNOSIS — Z01812 Encounter for preprocedural laboratory examination: Secondary | ICD-10-CM | POA: Diagnosis not present

## 2022-10-09 DIAGNOSIS — M25572 Pain in left ankle and joints of left foot: Secondary | ICD-10-CM | POA: Diagnosis not present

## 2022-10-09 DIAGNOSIS — Z01818 Encounter for other preprocedural examination: Secondary | ICD-10-CM | POA: Diagnosis present

## 2022-10-09 LAB — CBC WITH DIFFERENTIAL/PLATELET
Abs Immature Granulocytes: 0.09 10*3/uL — ABNORMAL HIGH (ref 0.00–0.07)
Basophils Absolute: 0.1 10*3/uL (ref 0.0–0.1)
Basophils Relative: 1 %
Eosinophils Absolute: 0.2 10*3/uL (ref 0.0–0.5)
Eosinophils Relative: 2 %
HCT: 44.4 % (ref 36.0–46.0)
Hemoglobin: 14.6 g/dL (ref 12.0–15.0)
Immature Granulocytes: 1 %
Lymphocytes Relative: 38 %
Lymphs Abs: 4.9 10*3/uL — ABNORMAL HIGH (ref 0.7–4.0)
MCH: 30.8 pg (ref 26.0–34.0)
MCHC: 32.9 g/dL (ref 30.0–36.0)
MCV: 93.7 fL (ref 80.0–100.0)
Monocytes Absolute: 1.1 10*3/uL — ABNORMAL HIGH (ref 0.1–1.0)
Monocytes Relative: 8 %
Neutro Abs: 6.6 10*3/uL (ref 1.7–7.7)
Neutrophils Relative %: 50 %
Platelets: 470 10*3/uL — ABNORMAL HIGH (ref 150–400)
RBC: 4.74 MIL/uL (ref 3.87–5.11)
RDW: 12.3 % (ref 11.5–15.5)
WBC: 13 10*3/uL — ABNORMAL HIGH (ref 4.0–10.5)
nRBC: 0 % (ref 0.0–0.2)

## 2022-10-09 LAB — POCT PREGNANCY, URINE: Preg Test, Ur: NEGATIVE

## 2022-10-09 LAB — BASIC METABOLIC PANEL
Anion gap: 11 (ref 5–15)
BUN: 12 mg/dL (ref 6–20)
CO2: 21 mmol/L — ABNORMAL LOW (ref 22–32)
Calcium: 8.9 mg/dL (ref 8.9–10.3)
Chloride: 106 mmol/L (ref 98–111)
Creatinine, Ser: 0.96 mg/dL (ref 0.44–1.00)
GFR, Estimated: >60 mL/min (ref >60)
Glucose, Bld: 94 mg/dL (ref 70–99)
Potassium: 3.6 mmol/L (ref 3.5–5.1)
Sodium: 138 mmol/L (ref 135–145)

## 2022-10-09 LAB — HEMOGLOBIN A1C
Hgb A1c MFr Bld: 5.7 % — ABNORMAL HIGH (ref 4.8–5.6)
Mean Plasma Glucose: 116.89 mg/dL

## 2022-10-14 NOTE — H&P (Signed)
Chief Complaint  Patient presents with   Ankle Pain      Left ankle pain        Patient: [name]                                           Date of Birth: 1971-02-02                                                 MRN: [id number] Visit Date: 09/25/2022 Requested by: Wilmon Pali, FNP [address] [place],  [place] [address] PCP: Wilmon Pali, FNP           Chief Complaint  Patient presents with   Ankle Pain      Left ankle pain      [name] is 52 years old she had a complicated course involving a left ankle fracture approximate 8 or 9 years ago complicated by flexion contracture and RSD.  She became disabled   She comes in today with requirements from [facility] and disability to be seen by an orthopedist for reevaluation.  A month ago she had excessive swelling after walking for a long time.  Her doctor sent her to urgent care they would not see her and said she needed to see the orthopedist who did the surgery so she is here for the reason as well   She still has the RSD symptoms with numbness and tingling on the dorsum of the foot the lateral 3 digits of the foot are numb he has limited range of motion plantigrade foot   Index procedure was  09/28/2013 ORIF trimalleolar ankle fracture with syndesmotic screw fixation no 11/09/2013 I&D lateral side for wound drainage December 28, 2013 had removal of syndesmosis screw and Achilles tendon Z-lengthening 10/25/2014 stitch abscess           Body mass index is 31.37 kg/m.     Problem list, medical hx, medications and allergies reviewed    Review of Systems  Neurological:  Positive for tingling, sensory change and weakness.        Allergies       Allergies  Allergen Reactions   Clindamycin/Lincomycin Itching      Rash with itching   Vancomycin        Possible hives ???   Wellbutrin [Bupropion] Hives        BP (!) 122/90   Pulse 96   Ht 5\' 1"  (1.549 m)   Wt 166 lb (75.3 kg)   BMI 31.37 kg/m       Physical exam: Physical Exam   General: Normal appearance   CDV: Normal pulses in both feet cool left foot   MS: Awake alert oriented   PSYCH: Normal   NEURO: Hypersensitivity dorsum of foot decreased sensation third fourth and fifth digit of the foot   MSK: Ankle, left ankle   Plantigrade left foot 5 degrees dorsiflexion 10 plantarflexion normal subtalar motion no tenderness in the joint line prominent pains medially normal lateral incision except for scarring no prominence of hardware   Data reviewed:    Prior operative notes and today's x-rays with the marked the pins are prominent arthritic to the skin   Assessment and plan:  Encounter Diagnosis  Name Primary?   Pain in left ankle and joints of left foot Yes      Recommend hardware removal medial pins from the left ankle    Patient disabled cannot walk or stand for any length of time   Anticipate speaking with Social Security disability to discuss what type of No orders of the defined types were placed in this encounter.     Procedures:    Scheduled hardware removal of the left ankle

## 2022-10-15 ENCOUNTER — Ambulatory Visit (HOSPITAL_BASED_OUTPATIENT_CLINIC_OR_DEPARTMENT_OTHER): Payer: Medicaid Other | Admitting: Anesthesiology

## 2022-10-15 ENCOUNTER — Ambulatory Visit (HOSPITAL_COMMUNITY): Payer: Medicaid Other | Admitting: Anesthesiology

## 2022-10-15 ENCOUNTER — Ambulatory Visit (HOSPITAL_COMMUNITY): Payer: Medicaid Other

## 2022-10-15 ENCOUNTER — Encounter (HOSPITAL_COMMUNITY): Payer: Self-pay | Admitting: Orthopedic Surgery

## 2022-10-15 ENCOUNTER — Ambulatory Visit (HOSPITAL_COMMUNITY)
Admission: RE | Admit: 2022-10-15 | Discharge: 2022-10-15 | Disposition: A | Discharge status: Home / Self Care | Payer: Medicaid Other | Attending: Orthopedic Surgery | Admitting: Orthopedic Surgery

## 2022-10-15 ENCOUNTER — Encounter (HOSPITAL_COMMUNITY)
Admission: RE | Disposition: A | Discharge status: Home / Self Care | Payer: Self-pay | Source: Home / Self Care | Attending: Orthopedic Surgery

## 2022-10-15 DIAGNOSIS — Z87891 Personal history of nicotine dependence: Secondary | ICD-10-CM | POA: Diagnosis not present

## 2022-10-15 DIAGNOSIS — T8484XA Pain due to internal orthopedic prosthetic devices, implants and grafts, initial encounter: Secondary | ICD-10-CM

## 2022-10-15 DIAGNOSIS — Y798 Miscellaneous orthopedic devices associated with adverse incidents, not elsewhere classified: Secondary | ICD-10-CM | POA: Insufficient documentation

## 2022-10-15 DIAGNOSIS — T84498A Other mechanical complication of other internal orthopedic devices, implants and grafts, initial encounter: Secondary | ICD-10-CM

## 2022-10-15 DIAGNOSIS — G709 Myoneural disorder, unspecified: Secondary | ICD-10-CM | POA: Diagnosis not present

## 2022-10-15 DIAGNOSIS — I10 Essential (primary) hypertension: Secondary | ICD-10-CM | POA: Diagnosis not present

## 2022-10-15 DIAGNOSIS — J45909 Unspecified asthma, uncomplicated: Secondary | ICD-10-CM | POA: Insufficient documentation

## 2022-10-15 HISTORY — PX: HARDWARE REMOVAL: SHX979

## 2022-10-15 LAB — GLUCOSE, CAPILLARY
Glucose-Capillary: 78 mg/dL (ref 70–99)
Glucose-Capillary: 124 mg/dL — ABNORMAL HIGH (ref 70–99)

## 2022-10-15 SURGERY — REMOVAL, HARDWARE
Anesthesia: General | Site: Ankle | Laterality: Left

## 2022-10-15 MED ORDER — DEXTROSE 50 % IV SOLN
25.0000 mL | Freq: Once | INTRAVENOUS | Status: AC
  Administered 2022-10-15: 25 mL via INTRAVENOUS

## 2022-10-15 MED ORDER — DEXAMETHASONE SODIUM PHOSPHATE 10 MG/ML IJ SOLN
INTRAMUSCULAR | Status: AC
  Filled 2022-10-15: qty 1

## 2022-10-15 MED ORDER — EPHEDRINE 5 MG/ML INJ
INTRAVENOUS | Status: AC
  Filled 2022-10-15: qty 5

## 2022-10-15 MED ORDER — DEXMEDETOMIDINE HCL IN NACL 80 MCG/20ML IV SOLN
INTRAVENOUS | Status: AC
  Filled 2022-10-15: qty 20

## 2022-10-15 MED ORDER — DEXAMETHASONE SODIUM PHOSPHATE 4 MG/ML IJ SOLN
INTRAMUSCULAR | Status: DC | PRN
  Administered 2022-10-15: 5 mg via INTRAVENOUS

## 2022-10-15 MED ORDER — MEPERIDINE HCL 50 MG/ML IJ SOLN: 6.2500 mg | INTRAMUSCULAR | Status: DC | PRN

## 2022-10-15 MED ORDER — FENTANYL CITRATE (PF) 250 MCG/5ML IJ SOLN
INTRAMUSCULAR | Status: DC | PRN
  Administered 2022-10-15 (×2): 50 ug via INTRAVENOUS

## 2022-10-15 MED ORDER — DEXMEDETOMIDINE HCL IN NACL 80 MCG/20ML IV SOLN
INTRAVENOUS | Status: DC | PRN
Start: 2022-10-15 — End: 2022-10-15
  Administered 2022-10-15: 8 ug via INTRAVENOUS
  Administered 2022-10-15: 12 ug via INTRAVENOUS

## 2022-10-15 MED ORDER — HYDROMORPHONE HCL 1 MG/ML IJ SOLN: 0.2500 mg | INTRAMUSCULAR | Status: DC | PRN

## 2022-10-15 MED ORDER — PROPOFOL 10 MG/ML IV BOLUS
INTRAVENOUS | Status: AC
  Filled 2022-10-15: qty 20

## 2022-10-15 MED ORDER — KETAMINE HCL 50 MG/5ML IJ SOSY
PREFILLED_SYRINGE | INTRAMUSCULAR | Status: AC
  Filled 2022-10-15: qty 5

## 2022-10-15 MED ORDER — LIDOCAINE HCL (PF) 2 % IJ SOLN
INTRAMUSCULAR | Status: AC
  Filled 2022-10-15: qty 5

## 2022-10-15 MED ORDER — IBUPROFEN 800 MG PO TABS: 800.0000 mg | ORAL_TABLET | Freq: Three times a day (TID) | ORAL | 1 refills | Status: DC | PRN

## 2022-10-15 MED ORDER — MIDAZOLAM HCL 2 MG/2ML IJ SOLN
INTRAMUSCULAR | Status: DC | PRN
  Administered 2022-10-15: 2 mg via INTRAVENOUS

## 2022-10-15 MED ORDER — CEFAZOLIN SODIUM-DEXTROSE 2-4 GM/100ML-% IV SOLN
2.0000 g | INTRAVENOUS | Status: AC
  Administered 2022-10-15: 2 g via INTRAVENOUS
  Filled 2022-10-15: qty 100

## 2022-10-15 MED ORDER — ACETAMINOPHEN 500 MG PO TABS: 500.0000 mg | ORAL_TABLET | Freq: Four times a day (QID) | ORAL | 0 refills | Status: AC | PRN

## 2022-10-15 MED ORDER — LACTATED RINGERS IV SOLN: INTRAVENOUS | Status: DC

## 2022-10-15 MED ORDER — BUPIVACAINE-EPINEPHRINE (PF) 0.5% -1:200000 IJ SOLN
INTRAMUSCULAR | Status: AC
  Filled 2022-10-15: qty 30

## 2022-10-15 MED ORDER — PHENYLEPHRINE 80 MCG/ML (10ML) SYRINGE FOR IV PUSH (FOR BLOOD PRESSURE SUPPORT)
PREFILLED_SYRINGE | INTRAVENOUS | Status: AC
  Filled 2022-10-15: qty 10

## 2022-10-15 MED ORDER — LIDOCAINE 2% (20 MG/ML) 5 ML SYRINGE
INTRAMUSCULAR | Status: DC | PRN
  Administered 2022-10-15: 100 mg via INTRAVENOUS

## 2022-10-15 MED ORDER — ONDANSETRON HCL 4 MG/2ML IJ SOLN: 4.0000 mg | Freq: Once | INTRAMUSCULAR | Status: DC | PRN

## 2022-10-15 MED ORDER — ONDANSETRON HCL 4 MG/2ML IJ SOLN
INTRAMUSCULAR | Status: DC | PRN
  Administered 2022-10-15: 4 mg via INTRAVENOUS

## 2022-10-15 MED ORDER — ROCURONIUM BROMIDE 10 MG/ML (PF) SYRINGE
PREFILLED_SYRINGE | INTRAVENOUS | Status: AC
  Filled 2022-10-15: qty 10

## 2022-10-15 MED ORDER — ONDANSETRON HCL 4 MG/2ML IJ SOLN
INTRAMUSCULAR | Status: AC
  Filled 2022-10-15: qty 2

## 2022-10-15 MED ORDER — SUGAMMADEX SODIUM 200 MG/2ML IV SOLN
INTRAVENOUS | Status: DC | PRN
  Administered 2022-10-15: 200 mg via INTRAVENOUS

## 2022-10-15 MED ORDER — PROPOFOL 10 MG/ML IV BOLUS
INTRAVENOUS | Status: DC | PRN
  Administered 2022-10-15: 200 mg via INTRAVENOUS

## 2022-10-15 MED ORDER — KETAMINE HCL 10 MG/ML IJ SOLN
INTRAMUSCULAR | Status: DC | PRN
Start: 2022-10-15 — End: 2022-10-15
  Administered 2022-10-15 (×2): 20 mg via INTRAVENOUS

## 2022-10-15 MED ORDER — ORAL CARE MOUTH RINSE: 15.0000 mL | Freq: Once | OROMUCOSAL | Status: AC

## 2022-10-15 MED ORDER — ROCURONIUM BROMIDE 10 MG/ML (PF) SYRINGE
PREFILLED_SYRINGE | INTRAVENOUS | Status: DC | PRN
  Administered 2022-10-15: 50 mg via INTRAVENOUS

## 2022-10-15 MED ORDER — MIDAZOLAM HCL 2 MG/2ML IJ SOLN
INTRAMUSCULAR | Status: AC
  Filled 2022-10-15: qty 2

## 2022-10-15 MED ORDER — EPHEDRINE SULFATE-NACL 50-0.9 MG/10ML-% IV SOSY
PREFILLED_SYRINGE | INTRAVENOUS | Status: DC | PRN
  Administered 2022-10-15: 5 mg via INTRAVENOUS

## 2022-10-15 MED ORDER — PHENYLEPHRINE 80 MCG/ML (10ML) SYRINGE FOR IV PUSH (FOR BLOOD PRESSURE SUPPORT)
PREFILLED_SYRINGE | INTRAVENOUS | Status: DC | PRN
  Administered 2022-10-15 (×2): 160 ug via INTRAVENOUS

## 2022-10-15 MED ORDER — SODIUM CHLORIDE 0.9 % IR SOLN
Status: DC | PRN
  Administered 2022-10-15: 1000 mL

## 2022-10-15 MED ORDER — DEXTROSE 50 % IV SOLN
INTRAVENOUS | Status: AC
  Filled 2022-10-15: qty 50

## 2022-10-15 MED ORDER — BUPIVACAINE-EPINEPHRINE 0.5% -1:200000 IJ SOLN
INTRAMUSCULAR | Status: DC | PRN
  Administered 2022-10-15: 2 mL

## 2022-10-15 MED ORDER — FENTANYL CITRATE (PF) 100 MCG/2ML IJ SOLN
INTRAMUSCULAR | Status: AC
  Filled 2022-10-15: qty 2

## 2022-10-15 MED ORDER — CHLORHEXIDINE GLUCONATE 0.12 % MT SOLN
15.0000 mL | Freq: Once | OROMUCOSAL | Status: AC
  Administered 2022-10-15: 15 mL via OROMUCOSAL

## 2022-10-15 SURGICAL SUPPLY — 37 items
APL PRP STRL LF DISP 70% ISPRP (MISCELLANEOUS) ×1
BANDAGE ESMARK 4X12 BL STRL LF (DISPOSABLE) ×1 IMPLANT
BLADE SURG SZ10 CARB STEEL (BLADE) IMPLANT
BNDG CMPR 12X4 ELC STRL LF (DISPOSABLE) ×1
BNDG CMPR STD VLCR NS LF 5.8X3 (GAUZE/BANDAGES/DRESSINGS) ×1
BNDG ELASTIC 3X5.8 VLCR NS LF (GAUZE/BANDAGES/DRESSINGS) IMPLANT
BNDG ESMARK 4X12 BLUE STRL LF (DISPOSABLE) ×1
CHLORAPREP W/TINT 26 (MISCELLANEOUS) ×1 IMPLANT
CLOTH BEACON ORANGE TIMEOUT ST (SAFETY) ×1 IMPLANT
COVER LIGHT HANDLE STERIS (MISCELLANEOUS) ×2 IMPLANT
CUFF TOURN SGL QUICK 34 (TOURNIQUET CUFF) ×1
CUFF TRNQT CYL 34X4.125X (TOURNIQUET CUFF) IMPLANT
DRAPE C-ARM FOLDED MOBILE STRL (DRAPES) IMPLANT
ELECT REM PT RETURN 9FT ADLT (ELECTROSURGICAL) ×1
ELECTRODE REM PT RTRN 9FT ADLT (ELECTROSURGICAL) ×1 IMPLANT
GAUZE SPONGE 4X4 12PLY STRL (GAUZE/BANDAGES/DRESSINGS) IMPLANT
GAUZE XEROFORM 5X9 LF (GAUZE/BANDAGES/DRESSINGS) IMPLANT
GLOVE BIOGEL PI IND STRL 7.0 (GLOVE) ×2 IMPLANT
GLOVE SS N UNI LF 8.5 STRL (GLOVE) ×1 IMPLANT
GLOVE SURG POLYISO LF SZ8 (GLOVE) ×1 IMPLANT
GOWN STRL REUS W/TWL LRG LVL3 (GOWN DISPOSABLE) ×1 IMPLANT
GOWN STRL REUS W/TWL XL LVL3 (GOWN DISPOSABLE) ×1 IMPLANT
KIT TURNOVER KIT A (KITS) ×1 IMPLANT
MANIFOLD NEPTUNE II (INSTRUMENTS) ×1 IMPLANT
NDL HYPO 21X1.5 SAFETY (NEEDLE) ×1 IMPLANT
NEEDLE HYPO 21X1.5 SAFETY (NEEDLE) ×1
NS IRRIG 1000ML POUR BTL (IV SOLUTION) ×1 IMPLANT
PACK BASIC LIMB (CUSTOM PROCEDURE TRAY) ×1 IMPLANT
PAD ARMBOARD 7.5X6 YLW CONV (MISCELLANEOUS) ×1 IMPLANT
PADDING CAST COTTON 6X4 STRL (CAST SUPPLIES) IMPLANT
SET BASIN LINEN APH (SET/KITS/TRAYS/PACK) ×1 IMPLANT
SPONGE T-LAP 18X18 ~~LOC~~+RFID (SPONGE) ×1 IMPLANT
STRIP CLOSURE SKIN 1/2X4 (GAUZE/BANDAGES/DRESSINGS) IMPLANT
SUT MON AB 2-0 SH 27 (SUTURE) ×1
SUT MON AB 2-0 SH27 (SUTURE) IMPLANT
SYR 30ML LL (SYRINGE) ×1 IMPLANT
SYR BULB IRRIG 60ML STRL (SYRINGE) ×2 IMPLANT

## 2022-10-15 NOTE — Op Note (Signed)
10/15/2022  8:09 AM  PATIENT:  [name]  52 y.o. female  History 52 year old female multiple surgeries on her left ankle years ago developed RSD.  Presented for disability evaluation and x-rays showed prominent pins on the medial side of the ankle.  I suggested that she get these removed before they penetrate the skin.  PRE-OPERATIVE DIAGNOSIS:  painful retained hardware left ankle  POST-OPERATIVE DIAGNOSIS:  painful retained hardware left ankle  PROCEDURE:  Procedure(s): HARDWARE REMOVAL left ankle (Left)  Findings imaging showed the fracture to be healed both pins were removed and confirmed with imaging ankle mortise was intact  Details of procedure after reevaluation of the patient in the preop area she was cleared for surgery.  The surgical site was confirmed as the left ankle and marked with the surgeon's initials.  She was brought to the operating for general anesthesia.  She was in the supine position.  The left ankle and foot and portion of the leg were prepped and draped sterilely  Timeout was completed  A 1 cm incision was made over the medial ankle using the previous incision line.  The prominent pin was immediately encountered and removed.  A second pin posterior to it was also removed.  The wound was irrigated.  Electrocautery was used for coagulation.  A 2-0 Monocryl running suture with Steri-Strips were placed.  2 cc of Marcaine with epinephrine were injected into the wound  Sterile dressing applied.  Tourniquet released.  Patient extubated taken recovery room in stable condition.  Postop plan  Dressing change 7 to 10 days in the office Hip dressing clean and dry for 7 to 10 days Weight-bear as tolerated in a postop hard sole shoe  SURGEON:  Surgeons and Role:    * Vickki Hearing, MD - Primary  PHYSICIAN ASSISTANT:   ASSISTANTS: none   ANESTHESIA:   general  EBL:  none   BLOOD ADMINISTERED:none  DRAINS: none   LOCAL MEDICATIONS USED:  MARCAINE      SPECIMEN:  No Specimen  DISPOSITION OF SPECIMEN:  N/A  COUNTS:  YES  TOURNIQUET:   Total Tourniquet Time Documented: Thigh (Left) - 14 minutes Total: Thigh (Left) - 14 minutes   DICTATION: .Reubin Milan Dictation  PLAN OF CARE: Discharge to home after PACU  PATIENT DISPOSITION:  PACU - hemodynamically stable.   Delay start of Pharmacological VTE agent (>24hrs) due to surgical blood loss or risk of bleeding: n/a

## 2022-10-15 NOTE — Anesthesia Postprocedure Evaluation (Signed)
Anesthesia Post Note  Patient: [name]  Procedure(s) Performed: HARDWARE REMOVAL left ankle (Left: Ankle)  Patient location during evaluation: Phase II Anesthesia Type: General Level of consciousness: awake and alert and oriented Pain management: pain level controlled Vital Signs Assessment: post-procedure vital signs reviewed and stable Respiratory status: spontaneous breathing, nonlabored ventilation and respiratory function stable Cardiovascular status: blood pressure returned to baseline and stable Postop Assessment: no apparent nausea or vomiting Anesthetic complications: no  No notable events documented.   Last Vitals:  Vitals:   10/15/22 0842 10/15/22 0852  BP: 116/88 121/79  Pulse: 79 88  Resp: 15 16  Temp:    SpO2: 98% 100%    Last Pain:  Vitals:   10/15/22 0852  TempSrc:   PainSc: 0-No pain                 [name] C Jestine Bicknell

## 2022-10-15 NOTE — Discharge Instructions (Addendum)
Keep bandage clean and dry. Dressings will be changed in the office in 7-10 days. Call the office to set up appointment. Weight bearing as tolerated only while wearing the post-op shoe.  Resume normal diet and drink plenty of fluids today to stay hydrated. Call the doctor for any signs of infection such as fever greater than 101, redness or drainage that has a foul odor at the incision site, or pain unrelieved with pain medicine. NO driving for 24 hours until anesthesia is no longer in your system.

## 2022-10-15 NOTE — Transfer of Care (Signed)
Immediate Anesthesia Transfer of Care Note  Patient: [name]  Procedure(s) Performed: HARDWARE REMOVAL left ankle (Left: Ankle)  Patient Location: PACU  Anesthesia Type:General  Level of Consciousness: drowsy  Airway & Oxygen Therapy: Patient Spontanous Breathing and Patient connected to face mask oxygen  Post-op Assessment: Report given to RN and Post -op Vital signs reviewed and stable  Post vital signs: Reviewed and stable  Last Vitals:  Vitals Value Taken Time  BP    Temp    Pulse    Resp    SpO2      Last Pain:  Vitals:   10/15/22 0703  TempSrc: Oral  PainSc: 6       Patients Stated Pain Goal: 9 (10/15/22 0703)  Complications: No notable events documented.

## 2022-10-15 NOTE — Interval H&P Note (Signed)
History and Physical Interval Note:  10/15/2022 7:22 AM  [name]  has presented today for surgery, with the diagnosis of painful retained hardware left ankle.  The various methods of treatment have been discussed with the patient and family. After consideration of risks, benefits and other options for treatment, the patient has consented to  Procedure(s): HARDWARE REMOVAL left ankle (Left) as a surgical intervention.  The patient's history has been reviewed, patient examined, no change in status, stable for surgery.  I have reviewed the patient's chart and labs.  Questions were answered to the patient's satisfaction.     Fuller Canada

## 2022-10-15 NOTE — Anesthesia Procedure Notes (Addendum)
Procedure Name: Intubation Date/Time: 10/15/2022 7:36 AM  Performed by: Julian Reil, CRNAPre-anesthesia Checklist: Patient identified, Emergency Drugs available, Suction available and Patient being monitored Patient Re-evaluated:Patient Re-evaluated prior to induction Oxygen Delivery Method: Circle system utilized Preoxygenation: Pre-oxygenation with 100% oxygen Induction Type: IV induction Ventilation: Mask ventilation without difficulty Laryngoscope Size: Mac and 4 Grade View: Grade II Tube type: Oral Tube size: 7.5 mm Number of attempts: 1 Airway Equipment and Method: Stylet Placement Confirmation: ETT inserted through vocal cords under direct vision, positive ETCO2 and breath sounds checked- equal and bilateral Secured at: 23 cm Tube secured with: Tape Dental Injury: Teeth and Oropharynx as per pre-operative assessment  Comments: 4x4s bite block used

## 2022-10-15 NOTE — Brief Op Note (Signed)
10/15/2022  8:09 AM  PATIENT:  Francie Massing  52 y.o. female  PRE-OPERATIVE DIAGNOSIS:  painful retained hardware left ankle  POST-OPERATIVE DIAGNOSIS:  painful retained hardware left ankle  PROCEDURE:  Procedure(s): HARDWARE REMOVAL left ankle (Left)  SURGEON:  Surgeons and Role:    Vickki Hearing, MD - Primary  PHYSICIAN ASSISTANT:   ASSISTANTS: none   ANESTHESIA:   general  EBL:  none   BLOOD ADMINISTERED:none  DRAINS: none   LOCAL MEDICATIONS USED:  MARCAINE     SPECIMEN:  No Specimen  DISPOSITION OF SPECIMEN:  N/A  COUNTS:  YES  TOURNIQUET:   Total Tourniquet Time Documented: Thigh (Left) - 14 minutes Total: Thigh (Left) - 14 minutes   DICTATION: .Reubin Milan Dictation  PLAN OF CARE: Discharge to home after PACU  PATIENT DISPOSITION:  PACU - hemodynamically stable.   Delay start of Pharmacological VTE agent (>24hrs) due to surgical blood loss or risk of bleeding: n/a

## 2022-10-15 NOTE — Anesthesia Preprocedure Evaluation (Addendum)
Anesthesia Evaluation  Patient identified by MRN, date of birth, ID band Patient awake    Reviewed: Allergy & Precautions, H&P , NPO status , Patient's Chart, lab work & pertinent test results  Airway Mallampati: III  TM Distance: >3 FB Neck ROM: Full    Dental  (+) Dental Advisory Given, Missing   Pulmonary asthma , former smoker   Pulmonary exam normal breath sounds clear to auscultation       Cardiovascular hypertension, Pt. on medications Normal cardiovascular exam Rhythm:Regular Rate:Normal     Neuro/Psych  Neuromuscular disease  negative psych ROS   GI/Hepatic negative GI ROS, Neg liver ROS,,,  Endo/Other  negative endocrine ROS    Renal/GU negative Renal ROS  negative genitourinary   Musculoskeletal negative musculoskeletal ROS (+)    Abdominal   Peds negative pediatric ROS (+)  Hematology  (+) Blood dyscrasia, anemia   Anesthesia Other Findings   Reproductive/Obstetrics negative OB ROS                             Anesthesia Physical Anesthesia Plan  ASA: 2  Anesthesia Plan: General   Post-op Pain Management: Dilaudid IV   Induction: Intravenous  PONV Risk Score and Plan: 4 or greater and Ondansetron and Dexamethasone  Airway Management Planned: LMA  Additional Equipment:   Intra-op Plan:   Post-operative Plan: Extubation in OR  Informed Consent: I have reviewed the patients History and Physical, chart, labs and discussed the procedure including the risks, benefits and alternatives for the proposed anesthesia with the patient or authorized representative who has indicated his/her understanding and acceptance.     Dental advisory given  Plan Discussed with: CRNA and Surgeon  Anesthesia Plan Comments:        Anesthesia Quick Evaluation

## 2022-10-16 ENCOUNTER — Encounter (HOSPITAL_COMMUNITY): Payer: Self-pay | Admitting: Orthopedic Surgery

## 2022-10-23 ENCOUNTER — Encounter: Payer: Self-pay | Admitting: Orthopedic Surgery

## 2022-10-23 ENCOUNTER — Ambulatory Visit (INDEPENDENT_AMBULATORY_CARE_PROVIDER_SITE_OTHER): Payer: Medicaid Other | Admitting: Orthopedic Surgery

## 2022-10-23 DIAGNOSIS — T8484XA Pain due to internal orthopedic prosthetic devices, implants and grafts, initial encounter: Secondary | ICD-10-CM

## 2022-10-23 DIAGNOSIS — G90522 Complex regional pain syndrome I of left lower limb: Secondary | ICD-10-CM

## 2022-10-23 NOTE — Progress Notes (Signed)
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   Painful orthopaedic hardware [facility]) left ankle hardware removal 10/15/22 Yes   Complex regional pain syndrome type 1 of left lower extremity    Return as needed

## 2022-11-04 ENCOUNTER — Encounter: Payer: Self-pay | Admitting: Orthopedic Surgery

## 2022-11-05 ENCOUNTER — Encounter: Payer: Self-pay | Admitting: Orthopedic Surgery

## 2022-11-05 ENCOUNTER — Ambulatory Visit (INDEPENDENT_AMBULATORY_CARE_PROVIDER_SITE_OTHER): Payer: Medicaid Other | Admitting: Orthopedic Surgery

## 2022-11-05 DIAGNOSIS — T8484XA Pain due to internal orthopedic prosthetic devices, implants and grafts, initial encounter: Secondary | ICD-10-CM

## 2022-11-05 MED ORDER — SULFAMETHOXAZOLE-TRIMETHOPRIM 800-160 MG PO TABS: 2.0000 | ORAL_TABLET | Freq: Two times a day (BID) | ORAL | 0 refills | Status: AC

## 2022-11-05 MED ORDER — HYDROCODONE-ACETAMINOPHEN 5-325 MG PO TABS
1.0000 | ORAL_TABLET | Freq: Four times a day (QID) | ORAL | 0 refills | Status: AC | PRN
Start: 2022-11-05 — End: ?

## 2022-11-05 NOTE — Progress Notes (Signed)
Orthopaedic Postop Note  Assessment: [name] is a 52 y.o. female s/p removal of hardware from medial left ankle (Dr. Romeo Apple)  DOS: 10/15/2022  Plan: [name] has had multiple surgeries on her left ankle.  Most recently, she had K wires removed from the medial left ankle, approximately 3 weeks ago.  Over the past 1-2 weeks she has had worsening pain in the medial ankle, as well as some drainage.  No fevers or chills.  There is some purulence at the medial incision.  As such, I am recommending Bactrim, as well as a short course of hydrocodone for pain control.  No change in her activities otherwise.  Dressings as needed.  Follow-up in 1 week with Dr. Romeo Apple.  Meds ordered this encounter  Medications   sulfamethoxazole-trimethoprim (BACTRIM DS) 800-160 MG tablet    Sig: Take 2 tablets by mouth 2 (two) times daily for 10 days.    Dispense:  40 tablet    Refill:  0   HYDROcodone-acetaminophen (NORCO/VICODIN) 5-325 MG tablet    Sig: Take 1 tablet by mouth every 6 (six) hours as needed.    Dispense:  20 tablet    Refill:  0     Follow-up: Return in about 1 week (around 11/12/2022) for 1 week with Dr. Romeo Apple. XR at next visit: As needed  Subjective:  Chief Complaint  Patient presents with   Post-op Problem    L ankle soreness and drainage that is getting progressively worse since surgery DOS 10/15/22    History of Present Illness: [name] is a 52 y.o. female who presents following the above stated procedure.  She has a long history of surgeries on her left ankle.  Recently, she had K wires removed from her her medial ankle.  This was completed approximately 3 weeks ago.  She was recovering appropriately, until about 1-2 weeks ago, when she started to experience worsening pain.  No fevers or chills.  She has noticed some drainage over the medial ankle.  Review of Systems: No fevers or chills No numbness or tingling No Chest Pain No shortness of  breath   Objective: There were no vitals taken for this visit.  Physical Exam:  Alert and oriented.  No acute distress.  Surgical incisions of healed.  Over the medial ankle, there is a small area of dehiscence, with some purulence.  This is tender to palpation.  She has some tenderness within the Achilles.     IMAGING: I personally ordered and reviewed the following images:  No new imaging obtained today    Oliver Barre, MD 11/05/2022 11:31 AM

## 2022-11-10 ENCOUNTER — Telehealth: Payer: Self-pay

## 2022-11-10 NOTE — Telephone Encounter (Signed)
Patient called my direct line stating she had been trying to get in touch with someone in the [place] office about her forms that Dr Romeo Apple was supposed to fill out for her and send back to social services for her disability. I am not familiar with this patient. Can you please call her and try to help her out when you get a chance? [phone]

## 2022-11-10 NOTE — Telephone Encounter (Signed)
Yes, she can bring forms by and drop at front desk they will let her know about form fees Kathie Rhodes called her earlier too so she knows what to do.

## 2022-11-13 ENCOUNTER — Encounter: Payer: Self-pay | Admitting: Orthopedic Surgery

## 2022-11-13 ENCOUNTER — Ambulatory Visit (INDEPENDENT_AMBULATORY_CARE_PROVIDER_SITE_OTHER): Payer: Medicaid Other | Admitting: Orthopedic Surgery

## 2022-11-13 DIAGNOSIS — T8484XA Pain due to internal orthopedic prosthetic devices, implants and grafts, initial encounter: Secondary | ICD-10-CM

## 2022-11-13 DIAGNOSIS — G8918 Other acute postprocedural pain: Secondary | ICD-10-CM

## 2022-11-13 DIAGNOSIS — T8149XA Infection following a procedure, other surgical site, initial encounter: Secondary | ICD-10-CM

## 2022-11-13 MED ORDER — DOXYCYCLINE HYCLATE 100 MG PO TABS: 100.0000 mg | ORAL_TABLET | Freq: Two times a day (BID) | ORAL | 1 refills | Status: AC

## 2022-11-13 MED ORDER — IBUPROFEN 800 MG PO TABS: 800.0000 mg | ORAL_TABLET | Freq: Three times a day (TID) | ORAL | 1 refills | Status: AC | PRN

## 2022-11-13 MED ORDER — HYDROCODONE-ACETAMINOPHEN 5-325 MG PO TABS: 1.0000 | ORAL_TABLET | Freq: Four times a day (QID) | ORAL | 0 refills | Status: AC | PRN

## 2022-11-13 NOTE — Progress Notes (Signed)
POST OP APPT   Chief Complaint  Patient presents with   Post-op Follow-up    Wearing rubber flip flops today complains of ankle  and leg pain states cleaned it a couple days ago not sure if wound is better or not 10/15/22 hardware removal ankle left     Encounter Diagnoses  Name Primary?   Painful orthopaedic hardware [facility]) left ankle hardware removal 10/15/22 Yes   Wound infection after surgery    BACTRIM (11/05/22)  POD 29  C/o pain and pain posteromedial lower leg   No ev of progression as there is no erythema however the wound still has a small opening see photos   I am going to try dial soap neosporin and doxycycline   Return 1 week   Meds ordered this encounter  Medications   doxycycline (VIBRA-TABS) 100 MG tablet    Sig: Take 1 tablet (100 mg total) by mouth 2 (two) times daily.    Dispense:  28 tablet    Refill:  1   HYDROcodone-acetaminophen (NORCO/VICODIN) 5-325 MG tablet    Sig: Take 1 tablet by mouth every 6 (six) hours as needed.    Dispense:  20 tablet    Refill:  0   ibuprofen (ADVIL) 800 MG tablet    Sig: Take 1 tablet (800 mg total) by mouth every 8 (eight) hours as needed.    Dispense:  90 tablet    Refill:  1

## 2022-11-20 ENCOUNTER — Ambulatory Visit (INDEPENDENT_AMBULATORY_CARE_PROVIDER_SITE_OTHER): Payer: Medicaid Other | Admitting: Orthopedic Surgery

## 2022-11-20 DIAGNOSIS — T8484XA Pain due to internal orthopedic prosthetic devices, implants and grafts, initial encounter: Secondary | ICD-10-CM | POA: Diagnosis not present

## 2022-11-20 NOTE — Progress Notes (Signed)
Follow-up appointment after hardware removal on September 4  Now on doxycycline with improved appearance of the wound less tenderness and swelling on the posteromedial aspect of the lower leg  She needs her disability forms to say that she cannot do any work  She says she cannot stand for more than a couple hours and the leg will swell  She also asked about amputating the leg which I am totally against  Follow-up in 2 weeks continue doxycycline

## 2022-12-04 ENCOUNTER — Ambulatory Visit (INDEPENDENT_AMBULATORY_CARE_PROVIDER_SITE_OTHER): Payer: Medicaid Other | Admitting: Orthopedic Surgery

## 2022-12-04 DIAGNOSIS — T8484XA Pain due to internal orthopedic prosthetic devices, implants and grafts, initial encounter: Secondary | ICD-10-CM

## 2022-12-04 DIAGNOSIS — G90521 Complex regional pain syndrome I of right lower limb: Secondary | ICD-10-CM

## 2022-12-04 DIAGNOSIS — T8149XA Infection following a procedure, other surgical site, initial encounter: Secondary | ICD-10-CM

## 2022-12-04 MED ORDER — GABAPENTIN 100 MG PO CAPS: 100.0000 mg | ORAL_CAPSULE | Freq: Three times a day (TID) | ORAL | 2 refills | Status: DC

## 2022-12-04 NOTE — Patient Instructions (Signed)
Stop ibuprofen   Take new meds he will send in to [facility]

## 2022-12-04 NOTE — Progress Notes (Signed)
Encounter Diagnoses  Name Primary?   Painful orthopaedic hardware [facility]) left ankle hardware removal 10/15/22    Complex regional pain syndrome type 1 of right lower extremity Yes   Wound infection after surgery     Chief Complaint  Patient presents with   Routine Post Op    Hardware Removal L ankle DOS 10/15/22   10/15/2022 developed postop wound infection which seems to have cleared with doxycycline  However pains of dysesthesias and paresthesias around left ankle  Unclear etiology.  His ankle has been problematic since original surgery  Recommend gabapentin start with low-dose increase up to 600 mg if needed Ibuprofen  Reluctant to try prednisone due to diabetes  This does not appear to be tenosynovitis or infection  Meds ordered this encounter  Medications   gabapentin (NEURONTIN) 100 MG capsule    Sig: Take 1 capsule (100 mg total) by mouth 3 (three) times daily.    Dispense:  90 capsule    Refill:  2

## 2022-12-29 ENCOUNTER — Encounter: Payer: Medicaid Other | Admitting: Orthopedic Surgery

## 2022-12-29 DIAGNOSIS — T8484XA Pain due to internal orthopedic prosthetic devices, implants and grafts, initial encounter: Secondary | ICD-10-CM

## 2023-05-01 ENCOUNTER — Other Ambulatory Visit: Payer: Self-pay | Admitting: Orthopedic Surgery

## 2023-05-01 DIAGNOSIS — G90521 Complex regional pain syndrome I of right lower limb: Secondary | ICD-10-CM

## 2023-05-01 DIAGNOSIS — T8484XA Pain due to internal orthopedic prosthetic devices, implants and grafts, initial encounter: Secondary | ICD-10-CM

## 2023-06-06 ENCOUNTER — Other Ambulatory Visit: Payer: Self-pay | Admitting: Orthopedic Surgery

## 2023-06-06 DIAGNOSIS — G90521 Complex regional pain syndrome I of right lower limb: Secondary | ICD-10-CM

## 2023-06-06 DIAGNOSIS — T8484XA Pain due to internal orthopedic prosthetic devices, implants and grafts, initial encounter: Secondary | ICD-10-CM

## 2023-07-05 ENCOUNTER — Other Ambulatory Visit: Payer: Self-pay | Admitting: Orthopedic Surgery

## 2023-07-05 DIAGNOSIS — G90521 Complex regional pain syndrome I of right lower limb: Secondary | ICD-10-CM

## 2023-07-05 DIAGNOSIS — T8484XA Pain due to internal orthopedic prosthetic devices, implants and grafts, initial encounter: Secondary | ICD-10-CM

## 2023-08-01 ENCOUNTER — Other Ambulatory Visit: Payer: Self-pay | Admitting: Orthopedic Surgery

## 2023-08-01 DIAGNOSIS — G90521 Complex regional pain syndrome I of right lower limb: Secondary | ICD-10-CM

## 2023-08-01 DIAGNOSIS — T8484XA Pain due to internal orthopedic prosthetic devices, implants and grafts, initial encounter: Secondary | ICD-10-CM

## 2023-08-29 ENCOUNTER — Other Ambulatory Visit: Payer: Self-pay | Admitting: Orthopedic Surgery

## 2023-08-29 DIAGNOSIS — G90521 Complex regional pain syndrome I of right lower limb: Secondary | ICD-10-CM

## 2023-08-29 DIAGNOSIS — T8484XA Pain due to internal orthopedic prosthetic devices, implants and grafts, initial encounter: Secondary | ICD-10-CM
